# Patient Record
Sex: Female | Born: 1985 | Race: Black or African American | Hispanic: No | Marital: Single | Smoking: Current every day smoker
Health system: Southern US, Community
[De-identification: ages and names within clinical notes are randomized; demographics above are authoritative.]

---

## 2018-07-14 ENCOUNTER — Emergency Department (HOSPITAL_BASED_OUTPATIENT_CLINIC_OR_DEPARTMENT_OTHER)
Admission: EM | Admit: 2018-07-14 | Discharge: 2018-07-14 | Disposition: A | Discharge status: Home / Self Care | Payer: Self-pay | Attending: Emergency Medicine | Admitting: Emergency Medicine

## 2018-07-14 ENCOUNTER — Other Ambulatory Visit: Payer: Self-pay

## 2018-07-14 ENCOUNTER — Encounter (HOSPITAL_BASED_OUTPATIENT_CLINIC_OR_DEPARTMENT_OTHER): Payer: Self-pay

## 2018-07-14 DIAGNOSIS — K2920 Alcoholic gastritis without bleeding: Secondary | ICD-10-CM | POA: Insufficient documentation

## 2018-07-14 DIAGNOSIS — R112 Nausea with vomiting, unspecified: Secondary | ICD-10-CM | POA: Insufficient documentation

## 2018-07-14 DIAGNOSIS — R1013 Epigastric pain: Secondary | ICD-10-CM | POA: Insufficient documentation

## 2018-07-14 DIAGNOSIS — F17210 Nicotine dependence, cigarettes, uncomplicated: Secondary | ICD-10-CM | POA: Insufficient documentation

## 2018-07-14 LAB — COMPREHENSIVE METABOLIC PANEL
ALT: 16 U/L (ref 0–44)
ANION GAP: 12 (ref 5–15)
AST: 21 U/L (ref 15–41)
Albumin: 4.1 g/dL (ref 3.5–5.0)
Alkaline Phosphatase: 53 U/L (ref 38–126)
BILIRUBIN TOTAL: 0.4 mg/dL (ref 0.3–1.2)
BUN: 12 mg/dL (ref 6–20)
CO2: 22 mmol/L (ref 22–32)
Calcium: 9.1 mg/dL (ref 8.9–10.3)
Chloride: 105 mmol/L (ref 98–111)
Creatinine, Ser: 0.89 mg/dL (ref 0.44–1.00)
GFR calc Af Amer: >60 mL/min (ref >60)
GFR calc non Af Amer: >60 mL/min (ref >60)
Glucose, Bld: 91 mg/dL (ref 70–99)
POTASSIUM: 3.9 mmol/L (ref 3.5–5.1)
Sodium: 139 mmol/L (ref 135–145)
TOTAL PROTEIN: 7.5 g/dL (ref 6.5–8.1)

## 2018-07-14 LAB — URINALYSIS, ROUTINE W REFLEX MICROSCOPIC
Bilirubin Urine: NEGATIVE
Glucose, UA: NEGATIVE mg/dL
Hgb urine dipstick: NEGATIVE
KETONES UR: NEGATIVE mg/dL
LEUKOCYTES UA: NEGATIVE
Nitrite: NEGATIVE
PH: 7.5 (ref 5.0–8.0)
Protein, ur: NEGATIVE mg/dL
Specific Gravity, Urine: 1.02 (ref 1.005–1.030)

## 2018-07-14 LAB — CBC
HEMATOCRIT: 42.9 % (ref 36.0–46.0)
Hemoglobin: 14.7 g/dL (ref 12.0–15.0)
MCH: 26.5 pg (ref 26.0–34.0)
MCHC: 34.3 g/dL (ref 30.0–36.0)
MCV: 77.3 fL — ABNORMAL LOW (ref 78.0–100.0)
Platelets: 373 10*3/uL (ref 150–400)
RBC: 5.55 MIL/uL — ABNORMAL HIGH (ref 3.87–5.11)
RDW: 15.7 % — AB (ref 11.5–15.5)
WBC: 10.1 10*3/uL (ref 4.0–10.5)

## 2018-07-14 LAB — LIPASE, BLOOD: LIPASE: 22 U/L (ref 11–51)

## 2018-07-14 LAB — PREGNANCY, URINE: Preg Test, Ur: NEGATIVE

## 2018-07-14 MED ORDER — SODIUM CHLORIDE 0.9 % IV BOLUS
1000.0000 mL | Freq: Once | INTRAVENOUS | Status: AC
  Administered 2018-07-14: 1000 mL via INTRAVENOUS

## 2018-07-14 MED ORDER — OMEPRAZOLE 20 MG PO CPDR: 20.0000 mg | DELAYED_RELEASE_CAPSULE | Freq: Every day | ORAL | 0 refills | Status: DC

## 2018-07-14 MED ORDER — ONDANSETRON HCL 4 MG PO TABS: 4.0000 mg | ORAL_TABLET | Freq: Three times a day (TID) | ORAL | 0 refills | Status: DC | PRN

## 2018-07-14 MED ORDER — ONDANSETRON HCL 4 MG/2ML IJ SOLN: 4.0000 mg | Freq: Once | INTRAMUSCULAR | Status: DC | PRN

## 2018-07-14 MED ORDER — GI COCKTAIL ~~LOC~~
30.0000 mL | Freq: Once | ORAL | Status: AC
  Administered 2018-07-14: 30 mL via ORAL
  Filled 2018-07-14: qty 30

## 2018-07-14 NOTE — ED Notes (Signed)
ED Provider at bedside. 

## 2018-07-14 NOTE — ED Triage Notes (Signed)
C/o abd pain, n/v/d started this am-NAD-steady gait

## 2018-07-14 NOTE — ED Provider Notes (Signed)
[facility] EMERGENCY DEPARTMENT Provider Note   CSN: [id number] Arrival date & time: 07/14/18  2031     History   Chief Complaint Chief Complaint  Patient presents with  . Abdominal Pain    HPI Becky Green is a 32 y.o. female.  The history is provided by the patient, a significant other and medical records. No language interpreter was used.  Abdominal Pain   This is a new problem. The current episode started 12 to 24 hours ago. The problem occurs constantly. The problem has not changed since onset.The pain is associated with alcohol use. The pain is located in the LUQ, RUQ and epigastric region. The quality of the pain is aching and sharp. The pain is at a severity of 10/10. The pain is severe. Associated symptoms include diarrhea, nausea and vomiting. Pertinent negatives include fever, constipation, dysuria and frequency. The symptoms are aggravated by eating and palpation. Nothing relieves the symptoms.    History reviewed. No pertinent past medical history.  There are no active problems to display for this patient.   History reviewed. No pertinent surgical history.   OB History   None      Home Medications    Prior to Admission medications   Not on File    Family History No family history on file.  Social History Social History   Tobacco Use  . Smoking status: Current Every Day Smoker    Types: Cigarettes  . Smokeless tobacco: Never Used  Substance Use Topics  . Alcohol use: Yes    Comment: occ  . Drug use: Yes    Types: Marijuana     Allergies   Patient has no known allergies.   Review of Systems Review of Systems  Constitutional: Negative for chills, diaphoresis, fatigue and fever.  HENT: Negative for congestion and rhinorrhea.   Respiratory: Negative for cough, chest tightness, shortness of breath and wheezing.   Cardiovascular: Negative for chest pain and palpitations.  Gastrointestinal: Positive for abdominal pain,  diarrhea, nausea and vomiting. Negative for constipation.  Genitourinary: Negative for dysuria, flank pain and frequency.  Musculoskeletal: Negative for back pain, neck pain and neck stiffness.  Neurological: Negative for light-headedness and numbness.  Psychiatric/Behavioral: Negative for agitation.  All other systems reviewed and are negative.    Physical Exam Updated Vital Signs BP (!) 145/88 (BP Location: Right Arm)   Pulse 89   Temp 98.4 F (36.9 C) (Oral)   Resp 20   Ht 4\' 11"  (1.499 m)   Wt 88.9 kg   LMP  (LMP Unknown)   SpO2 100%   BMI 39.59 kg/m   Physical Exam  Constitutional: She appears well-developed and well-nourished.  Non-toxic appearance. She does not appear ill. No distress.  HENT:  Head: Normocephalic and atraumatic.  Mouth/Throat: Oropharynx is clear and moist.  Eyes: Pupils are equal, round, and reactive to light. Conjunctivae are normal.  Neck: Neck supple.  Cardiovascular: Normal rate and regular rhythm.  No murmur heard. Pulmonary/Chest: Effort normal and breath sounds normal. No respiratory distress.  Abdominal: Soft. Bowel sounds are normal. There is tenderness in the right upper quadrant, epigastric area and left upper quadrant. There is no rigidity, no rebound, no guarding, no CVA tenderness and negative Murphy's sign.  Musculoskeletal: She exhibits no edema.  Neurological: She is alert.  Skin: Skin is warm and dry. Capillary refill takes less than 2 seconds. She is not diaphoretic. No erythema. No pallor.  Psychiatric: She has a normal mood and affect.  Nursing note and vitals reviewed.    ED Treatments / Results  Labs (all labs ordered are listed, but only abnormal results are displayed) Labs Reviewed  CBC - Abnormal; Notable for the following components:      Result Value   RBC 5.55 (*)    MCV 77.3 (*)    RDW 15.7 (*)    All other components within normal limits  LIPASE, BLOOD  COMPREHENSIVE METABOLIC PANEL  URINALYSIS, ROUTINE W  REFLEX MICROSCOPIC  PREGNANCY, URINE    EKG None  Radiology No results found.  Procedures Procedures (including critical care time)  Medications Ordered in ED Medications  ondansetron (ZOFRAN) injection 4 mg (has no administration in time range)     Initial Impression / Assessment and Plan / ED Course  I have reviewed the triage vital signs and the nursing notes.  Pertinent labs & imaging results that were available during my care of the patient were reviewed by me and considered in my medical decision making (see chart for details).     Becky Green is a 32 y.o. female with no significant past medical history who presents with a 1 day history of nausea vomiting, diarrhea, and abdominal pain.  Patient reports that she did a heavy metal drinking last night with wine.  She would not disclose how much she drank.  She says that this morning she woke up feeling bad.  She reports she has had upper abdominal pain across her upper abdomen.  She describes as 10 out of 10 in severity and sharp and cramping.  She has had nausea, vomiting, all day.  She has not been able to tolerate p.o. earlier.  She reports no blood in her emesis.  She reports she is had loose stools but no blood in her stool.  She denies any urinary symptoms or vaginal symptoms.  She denies recent trauma.  She denies any history of gallstones or pancreatitis.  She denies any history of abdominal surgeries.  She reports when she was young she had a kidney stone but has not had any flank or back pain.  She has not taken any medicine to help her symptoms.  On exam, upper abdomen is tender to palpation in the right upper quadrant, left upper quadrant, and epigastrium.  No CVA tenderness or flank tenderness.  Bowel sounds are appreciated.  Lungs clear chest nontender.  Suspect pancreatitis or gastritis from heavy alcohol use last night.  Patient will have screening laboratory testing and urinalysis.  Patient will have nausea  medicine and pain medicine and fluids administered.  Anticipate reassessment after work-up.  Given minimal tenderness on exam, will hold on CT imaging initially.  If work-up is concerning or symptoms worsen, will likely get either ultrasound or CT scan.  11:31 PM Patient's laboratory testing was reassuring.  Patient was given a GI cocktail when she felt it was more burning discomfort.  Discomfort resolved her abdominal pain and nausea.    Patient likely has an alcoholic gastritis causing her symptoms.  She will be started on omeprazole and follow-up with a PCP.  She will give prescription for nausea medicine.  Patient encouraged to avoid alcohol and spicy foods.  Patient understood return precautions and was discharged in good condition after rehydration and feeling better.   Final Clinical Impressions(s) / ED Diagnoses   Final diagnoses:  Nausea and vomiting, intractability of vomiting not specified, unspecified vomiting type  Epigastric pain  Acute alcoholic gastritis without hemorrhage    ED Discharge Orders  Ordered    omeprazole (PRILOSEC) 20 MG capsule  Daily     07/14/18 2335    ondansetron (ZOFRAN) 4 MG tablet  Every 8 hours PRN     07/14/18 2335          Clinical Impression: 1. Nausea and vomiting, intractability of vomiting not specified, unspecified vomiting type   2. Epigastric pain   3. Acute alcoholic gastritis without hemorrhage     Disposition: Discharge  Condition: Good  I have discussed the results, Dx and Tx plan with the pt(& family if present). He/she/they expressed understanding and agree(s) with the plan. Discharge instructions discussed at great length. Strict return precautions discussed and pt &/or family have verbalized understanding of the instructions. No further questions at time of discharge.    New Prescriptions   OMEPRAZOLE (PRILOSEC) 20 MG CAPSULE    Take 1 capsule (20 mg total) by mouth daily.   ONDANSETRON (ZOFRAN) 4 MG TABLET     Take 1 tablet (4 mg total) by mouth every 8 (eight) hours as needed.    Follow Up: [facility] [address] [place] [address] [phone] Schedule an appointment as soon as possible for a visit    [facility] [address] [id number] [address] [place] [place] [address] [phone]       Tegeler, Canary Brim, MD 07/14/18 [phone]

## 2018-07-14 NOTE — Discharge Instructions (Signed)
We suspect her symptoms of abdominal pain nausea and vomiting were due to the drinking he did last night.  I suspect you have irritation called gastritis on her stomach lining.  Since your symptoms have improved after fluids, nausea medicine, and the GI cocktail, we feel you are safe for discharge home.  Please use the Prilosec to help with the stomach and follow-up with a PCP.  Please use the nausea medicine and maintain hydration.  If any symptoms change or worsen, please return to the nearest emergency department.

## 2018-09-03 ENCOUNTER — Emergency Department (HOSPITAL_BASED_OUTPATIENT_CLINIC_OR_DEPARTMENT_OTHER): Payer: Self-pay

## 2018-09-03 ENCOUNTER — Emergency Department (HOSPITAL_BASED_OUTPATIENT_CLINIC_OR_DEPARTMENT_OTHER)
Admission: EM | Admit: 2018-09-03 | Discharge: 2018-09-03 | Disposition: A | Discharge status: Home / Self Care | Payer: Self-pay | Attending: Emergency Medicine | Admitting: Emergency Medicine

## 2018-09-03 ENCOUNTER — Other Ambulatory Visit: Payer: Self-pay

## 2018-09-03 ENCOUNTER — Encounter (HOSPITAL_BASED_OUTPATIENT_CLINIC_OR_DEPARTMENT_OTHER): Payer: Self-pay | Admitting: Emergency Medicine

## 2018-09-03 DIAGNOSIS — N7093 Salpingitis and oophoritis, unspecified: Secondary | ICD-10-CM

## 2018-09-03 DIAGNOSIS — A599 Trichomoniasis, unspecified: Secondary | ICD-10-CM | POA: Insufficient documentation

## 2018-09-03 DIAGNOSIS — N83292 Other ovarian cyst, left side: Secondary | ICD-10-CM | POA: Insufficient documentation

## 2018-09-03 DIAGNOSIS — Z79899 Other long term (current) drug therapy: Secondary | ICD-10-CM | POA: Insufficient documentation

## 2018-09-03 DIAGNOSIS — R1084 Generalized abdominal pain: Secondary | ICD-10-CM | POA: Insufficient documentation

## 2018-09-03 DIAGNOSIS — R102 Pelvic and perineal pain: Secondary | ICD-10-CM | POA: Insufficient documentation

## 2018-09-03 DIAGNOSIS — N7091 Salpingitis, unspecified: Secondary | ICD-10-CM | POA: Insufficient documentation

## 2018-09-03 DIAGNOSIS — F17210 Nicotine dependence, cigarettes, uncomplicated: Secondary | ICD-10-CM | POA: Insufficient documentation

## 2018-09-03 LAB — COMPREHENSIVE METABOLIC PANEL
ALT: 19 U/L (ref 0–44)
ANION GAP: 13 (ref 5–15)
AST: 20 U/L (ref 15–41)
Albumin: 4 g/dL (ref 3.5–5.0)
Alkaline Phosphatase: 56 U/L (ref 38–126)
BILIRUBIN TOTAL: 0.3 mg/dL (ref 0.3–1.2)
BUN: 19 mg/dL (ref 6–20)
CHLORIDE: 104 mmol/L (ref 98–111)
CO2: 22 mmol/L (ref 22–32)
Calcium: 9.1 mg/dL (ref 8.9–10.3)
Creatinine, Ser: 0.81 mg/dL (ref 0.44–1.00)
Glucose, Bld: 107 mg/dL — ABNORMAL HIGH (ref 70–99)
POTASSIUM: 4 mmol/L (ref 3.5–5.1)
Sodium: 139 mmol/L (ref 135–145)
TOTAL PROTEIN: 7.3 g/dL (ref 6.5–8.1)

## 2018-09-03 LAB — WET PREP, GENITAL
CLUE CELLS WET PREP: NONE SEEN
Sperm: NONE SEEN
YEAST WET PREP: NONE SEEN

## 2018-09-03 LAB — CBC WITH DIFFERENTIAL/PLATELET
Abs Immature Granulocytes: 0.04 10*3/uL (ref 0.00–0.07)
BASOS PCT: 0 %
Basophils Absolute: 0 10*3/uL (ref 0.0–0.1)
EOS PCT: 1 %
Eosinophils Absolute: 0.1 10*3/uL (ref 0.0–0.5)
HCT: 42.8 % (ref 36.0–46.0)
Hemoglobin: 13.3 g/dL (ref 12.0–15.0)
IMMATURE GRANULOCYTES: 0 %
Lymphocytes Relative: 24 %
Lymphs Abs: 2.6 10*3/uL (ref 0.7–4.0)
MCH: 26.2 pg (ref 26.0–34.0)
MCHC: 31.1 g/dL (ref 30.0–36.0)
MCV: 84.3 fL (ref 80.0–100.0)
MONOS PCT: 6 %
Monocytes Absolute: 0.6 10*3/uL (ref 0.1–1.0)
NEUTROS PCT: 69 %
NRBC: 0 % (ref 0.0–0.2)
Neutro Abs: 7.5 10*3/uL (ref 1.7–7.7)
PLATELETS: 339 10*3/uL (ref 150–400)
RBC: 5.08 MIL/uL (ref 3.87–5.11)
RDW: 15.3 % (ref 11.5–15.5)
WBC: 10.9 10*3/uL — ABNORMAL HIGH (ref 4.0–10.5)

## 2018-09-03 LAB — PREGNANCY, URINE: PREG TEST UR: NEGATIVE

## 2018-09-03 LAB — URINALYSIS, ROUTINE W REFLEX MICROSCOPIC
Bilirubin Urine: NEGATIVE
Glucose, UA: NEGATIVE mg/dL
HGB URINE DIPSTICK: NEGATIVE
KETONES UR: NEGATIVE mg/dL
LEUKOCYTES UA: NEGATIVE
Nitrite: NEGATIVE
PROTEIN: NEGATIVE mg/dL
Specific Gravity, Urine: >1.03 — ABNORMAL HIGH (ref 1.005–1.030)
pH: 6 (ref 5.0–8.0)

## 2018-09-03 LAB — LIPASE, BLOOD: LIPASE: 31 U/L (ref 11–51)

## 2018-09-03 MED ORDER — KETOROLAC TROMETHAMINE 30 MG/ML IJ SOLN
30.0000 mg | Freq: Once | INTRAMUSCULAR | Status: AC
  Administered 2018-09-03: 30 mg via INTRAVENOUS
  Filled 2018-09-03: qty 1

## 2018-09-03 MED ORDER — IOPAMIDOL (ISOVUE-300) INJECTION 61%
30.0000 mL | Freq: Once | INTRAVENOUS | Status: AC | PRN
  Administered 2018-09-03: 15 mL via ORAL

## 2018-09-03 MED ORDER — DEXTROSE 5 % IV SOLN
250.0000 mg | Freq: Once | INTRAVENOUS | Status: AC
  Administered 2018-09-03: 250 mg via INTRAVENOUS
  Filled 2018-09-03: qty 250

## 2018-09-03 MED ORDER — ONDANSETRON 4 MG PO TBDP: 4.0000 mg | ORAL_TABLET | Freq: Three times a day (TID) | ORAL | 0 refills | Status: DC | PRN

## 2018-09-03 MED ORDER — METRONIDAZOLE 500 MG PO TABS
500.0000 mg | ORAL_TABLET | Freq: Once | ORAL | Status: AC
  Administered 2018-09-03: 500 mg via ORAL
  Filled 2018-09-03: qty 1

## 2018-09-03 MED ORDER — LOPERAMIDE HCL 2 MG PO CAPS
2.0000 mg | ORAL_CAPSULE | Freq: Once | ORAL | Status: AC
  Administered 2018-09-03: 2 mg via ORAL
  Filled 2018-09-03: qty 1

## 2018-09-03 MED ORDER — DOXYCYCLINE HYCLATE 100 MG PO TABS
100.0000 mg | ORAL_TABLET | Freq: Once | ORAL | Status: AC
  Administered 2018-09-03: 100 mg via ORAL
  Filled 2018-09-03: qty 1

## 2018-09-03 MED ORDER — CEFTRIAXONE SODIUM 250 MG IJ SOLR
INTRAMUSCULAR | Status: AC
  Administered 2018-09-03: 250 mg
  Filled 2018-09-03: qty 250

## 2018-09-03 MED ORDER — DOXYCYCLINE HYCLATE 100 MG PO CAPS: 100.0000 mg | ORAL_CAPSULE | Freq: Two times a day (BID) | ORAL | 0 refills | Status: DC

## 2018-09-03 MED ORDER — ONDANSETRON HCL 4 MG/2ML IJ SOLN
4.0000 mg | Freq: Once | INTRAMUSCULAR | Status: AC
  Administered 2018-09-03: 4 mg via INTRAVENOUS
  Filled 2018-09-03: qty 2

## 2018-09-03 MED ORDER — METRONIDAZOLE 500 MG PO TABS: 500.0000 mg | ORAL_TABLET | Freq: Two times a day (BID) | ORAL | 0 refills | Status: DC

## 2018-09-03 MED ORDER — OXYCODONE-ACETAMINOPHEN 5-325 MG PO TABS
1.0000 | ORAL_TABLET | Freq: Once | ORAL | Status: AC
  Administered 2018-09-03: 1 via ORAL
  Filled 2018-09-03: qty 1

## 2018-09-03 MED ORDER — OXYCODONE-ACETAMINOPHEN 5-325 MG PO TABS: 1.0000 | ORAL_TABLET | ORAL | 0 refills | Status: DC | PRN

## 2018-09-03 MED ORDER — SODIUM CHLORIDE 0.9 % IV BOLUS (SEPSIS)
1000.0000 mL | Freq: Once | INTRAVENOUS | Status: AC
  Administered 2018-09-03: 1000 mL via INTRAVENOUS

## 2018-09-03 MED ORDER — SODIUM CHLORIDE 0.9 % IV SOLN
INTRAVENOUS | Status: DC | PRN
  Administered 2018-09-03: 500 mL via INTRAVENOUS

## 2018-09-03 MED ORDER — IOPAMIDOL (ISOVUE-300) INJECTION 61%
100.0000 mL | Freq: Once | INTRAVENOUS | Status: AC | PRN
  Administered 2018-09-03: 100 mL via INTRAVENOUS

## 2018-09-03 MED FILL — DOXYCYCLINE HYCLATE 100 MG: 100 | 14 days supply | Qty: 28 | Fill #0

## 2018-09-03 MED FILL — metroNIDAZOLE 500 MG TABS: 500 | 10 days supply | Qty: 20 | Fill #0

## 2018-09-03 MED FILL — OXYCODONE-ACETAMINOPHEN 5-3: 5-325 | 2 days supply | Qty: 12 | Fill #0

## 2018-09-03 MED FILL — ONDANSETRON ODT 4 MG TABLET: 4 | 2 days supply | Qty: 8 | Fill #0

## 2018-09-03 NOTE — ED Provider Notes (Signed)
TIME SEEN: 6:35 AM  CHIEF COMPLAINT: abdominal pain  HPI: Patient is a 32 year old female with history of obesity, marijuana use who presents to the emergency department with 3 days of abdominal pain.  Reports she has been vomiting twice a day for the past week and a half.  Over the past 3 days has increased to 4 times a day and she had loose stools this morning.  No fever.  Has had some white vaginal discharge that she reports is not abnormal for her.  Last menstrual period was the beginning of November.  She has never been pregnant.  No history of STDs.  No previous abdominal surgeries.  No sick contacts or recent travel.  Describes the pain is more severe in the lower abdomen but is diffuse.  It is painful to move, have a bowel movement, eat.  No blood in her stool or melena.  Had a bowel movement here in the emergency department that was loose.  ROS: See HPI Constitutional: no fever  Eyes: no drainage  ENT: no runny nose   Cardiovascular:  no chest pain  Resp: no SOB  GI:  vomiting GU: no dysuria Integumentary: no rash  Allergy: no hives  Musculoskeletal: no leg swelling  Neurological: no slurred speech ROS otherwise negative  PAST MEDICAL HISTORY/PAST SURGICAL HISTORY:  History reviewed. No pertinent past medical history.  MEDICATIONS:  Prior to Admission medications   Medication Sig Start Date End Date Taking? Authorizing Provider  omeprazole (PRILOSEC) 20 MG capsule Take 1 capsule (20 mg total) by mouth daily. 07/14/18   Tegeler, Canary Brim, MD  ondansetron (ZOFRAN) 4 MG tablet Take 1 tablet (4 mg total) by mouth every 8 (eight) hours as needed. 07/14/18   Tegeler, Canary Brim, MD    ALLERGIES:  No Known Allergies  SOCIAL HISTORY:  Social History   Tobacco Use  . Smoking status: Current Every Day Smoker    Types: Cigarettes  . Smokeless tobacco: Never Used  Substance Use Topics  . Alcohol use: Yes    Comment: occ    FAMILY HISTORY: Family History  Problem  Relation Age of Onset  . Diabetes Mother     EXAM: BP (!) 127/97 (BP Location: Left Arm)   Pulse 92   Temp 98.2 F (36.8 C) (Oral)   Resp 20   Ht 5' (1.524 m)   Wt 91.6 kg   LMP 08/16/2018 (Approximate)   SpO2 99%   BMI 39.45 kg/m  CONSTITUTIONAL: Alert and oriented and responds appropriately to questions. Well-appearing; well-nourished HEAD: Normocephalic EYES: Conjunctivae clear, pupils appear equal, EOMI ENT: normal nose; moist mucous membranes NECK: Supple, no meningismus, no nuchal rigidity, no LAD  CARD: RRR; S1 and S2 appreciated; no murmurs, no clicks, no rubs, no gallops RESP: Normal chest excursion without splinting or tachypnea; breath sounds clear and equal bilaterally; no wheezes, no rhonchi, no rales, no hypoxia or respiratory distress, speaking full sentences ABD/GI: Normal bowel sounds; non-distended; soft, diffusely tender throughout the abdomen with voluntary guarding, patient tearful when I palpate anywhere on her abdomen, no hepatosplenomegaly, no masses appreciated, no hernia GU:  Normal external genitalia. No lesions, rashes noted. Patient has no vaginal bleeding on exam. No vaginal discharge.  She reports significant pain with speculum examination and bimanual examination.  She has no adnexal fullness or masses appreciated.  Cervix is not appear friable.  Cervix is closed.  Chaperone present for exam. BACK:  The back appears normal and is non-tender to palpation, there is no  CVA tenderness EXT: Normal ROM in all joints; non-tender to palpation; no edema; normal capillary refill; no cyanosis, no calf tenderness or swelling    SKIN: Normal color for age and race; warm; no rash NEURO: Moves all extremities equally PSYCH: The patient's mood and manner are appropriate. Grooming and personal hygiene are appropriate.  MEDICAL DECISION MAKING: Patient here with vomiting, diarrhea.  She is diffusely tender throughout her abdomen although she states most of her pain is in  her lower abdomen she guards with any palpation of her abdomen including the upper abdomen and is tearful.  Differential includes gastroenteritis, colitis, diverticulitis, appendicitis, cholecystitis, pancreatitis.  Urine obtained in triage shows no sign of infection and she is not pregnant.  Will obtain labs, perform pelvic exam with cultures, obtain CT of abdomen pelvis.  Will treat symptomatically with IV fluids, Toradol, Zofran, Imodium.  ED PROGRESS: Patient has diffuse tenderness on her pelvic examination.  She is tearful when I perform speculum and bimanual examination stating she is having a lot of vaginal discomfort.  No significant discharge noted.  Differential also includes torsion, TOA, PID but this would not explain her upper abdominal pain.  She has no known history of endometriosis.  Ultrasound not available until 8 AM.  Will start with CT imaging.  If this is a torsion, patient is outside the window for salvage of her ovary given symptoms ongoing for 3 days.  Pregnancy test is negative therefore I am not concerned for ectopic.   Signed out to Dr. Clarene DukeLittle to follow up on patient's labs and imaging.   I reviewed all nursing notes, vitals, pertinent previous records, EKGs, lab and urine results, imaging (as available).     Prabhnoor Ellenberger, Layla MawKristen N, DO 09/03/18 [phone]

## 2018-09-03 NOTE — ED Notes (Signed)
Patient transported to Ultrasound 

## 2018-09-03 NOTE — ED Triage Notes (Signed)
Pt is c/o abd pain and pelvic pain  Pain started 3 days ago  Pt states it hurts to urinate or have a bowel movement  Pt states she has had vomiting now for a while but has gotten worse over the past few days  Pt reports vaginal discharge white in color

## 2018-09-04 LAB — GC/CHLAMYDIA PROBE AMP (~~LOC~~) NOT AT ARMC
CHLAMYDIA, DNA PROBE: NEGATIVE
NEISSERIA GONORRHEA: NEGATIVE

## 2019-05-11 ENCOUNTER — Emergency Department (HOSPITAL_BASED_OUTPATIENT_CLINIC_OR_DEPARTMENT_OTHER)
Admission: EM | Admit: 2019-05-11 | Discharge: 2019-05-11 | Disposition: A | Discharge status: Home / Self Care | Payer: Self-pay | Attending: Emergency Medicine | Admitting: Emergency Medicine

## 2019-05-11 ENCOUNTER — Emergency Department (HOSPITAL_BASED_OUTPATIENT_CLINIC_OR_DEPARTMENT_OTHER): Payer: Self-pay

## 2019-05-11 ENCOUNTER — Other Ambulatory Visit: Payer: Self-pay

## 2019-05-11 ENCOUNTER — Encounter (HOSPITAL_BASED_OUTPATIENT_CLINIC_OR_DEPARTMENT_OTHER): Payer: Self-pay | Admitting: *Deleted

## 2019-05-11 DIAGNOSIS — F17210 Nicotine dependence, cigarettes, uncomplicated: Secondary | ICD-10-CM | POA: Insufficient documentation

## 2019-05-11 DIAGNOSIS — R197 Diarrhea, unspecified: Secondary | ICD-10-CM | POA: Insufficient documentation

## 2019-05-11 DIAGNOSIS — R112 Nausea with vomiting, unspecified: Secondary | ICD-10-CM | POA: Insufficient documentation

## 2019-05-11 DIAGNOSIS — J302 Other seasonal allergic rhinitis: Secondary | ICD-10-CM

## 2019-05-11 DIAGNOSIS — J301 Allergic rhinitis due to pollen: Secondary | ICD-10-CM | POA: Insufficient documentation

## 2019-05-11 DIAGNOSIS — A084 Viral intestinal infection, unspecified: Secondary | ICD-10-CM

## 2019-05-11 DIAGNOSIS — R1011 Right upper quadrant pain: Secondary | ICD-10-CM

## 2019-05-11 LAB — COMPREHENSIVE METABOLIC PANEL WITH GFR
ALT: 15 U/L (ref 0–44)
AST: 15 U/L (ref 15–41)
Albumin: 4.2 g/dL (ref 3.5–5.0)
Alkaline Phosphatase: 64 U/L (ref 38–126)
Anion gap: 11 (ref 5–15)
BUN: 18 mg/dL (ref 6–20)
CO2: 20 mmol/L — ABNORMAL LOW (ref 22–32)
Calcium: 9.1 mg/dL (ref 8.9–10.3)
Chloride: 104 mmol/L (ref 98–111)
Creatinine, Ser: 1.01 mg/dL — ABNORMAL HIGH (ref 0.44–1.00)
GFR calc Af Amer: >60 mL/min
GFR calc non Af Amer: >60 mL/min
Glucose, Bld: 91 mg/dL (ref 70–99)
Potassium: 4.3 mmol/L (ref 3.5–5.1)
Sodium: 135 mmol/L (ref 135–145)
Total Bilirubin: 0.3 mg/dL (ref 0.3–1.2)
Total Protein: 7.5 g/dL (ref 6.5–8.1)

## 2019-05-11 LAB — CBC
HCT: 46.9 % — ABNORMAL HIGH (ref 36.0–46.0)
Hemoglobin: 14.9 g/dL (ref 12.0–15.0)
MCH: 27.1 pg (ref 26.0–34.0)
MCHC: 31.8 g/dL (ref 30.0–36.0)
MCV: 85.3 fL (ref 80.0–100.0)
Platelets: 355 K/uL (ref 150–400)
RBC: 5.5 MIL/uL — ABNORMAL HIGH (ref 3.87–5.11)
RDW: 14.5 % (ref 11.5–15.5)
WBC: 9.1 K/uL (ref 4.0–10.5)
nRBC: 0 % (ref 0.0–0.2)

## 2019-05-11 LAB — URINALYSIS, ROUTINE W REFLEX MICROSCOPIC
Bilirubin Urine: NEGATIVE
Glucose, UA: NEGATIVE mg/dL
Hgb urine dipstick: NEGATIVE
Ketones, ur: NEGATIVE mg/dL
Leukocytes,Ua: NEGATIVE
Nitrite: NEGATIVE
Protein, ur: NEGATIVE mg/dL
Specific Gravity, Urine: 1.02 (ref 1.005–1.030)
pH: 7 (ref 5.0–8.0)

## 2019-05-11 LAB — PREGNANCY, URINE: Preg Test, Ur: NEGATIVE

## 2019-05-11 LAB — LIPASE, BLOOD: Lipase: 25 U/L (ref 11–51)

## 2019-05-11 MED ORDER — SODIUM CHLORIDE 0.9% FLUSH
3.0000 mL | Freq: Once | INTRAVENOUS | Status: DC
  Filled 2019-05-11: qty 3

## 2019-05-11 MED ORDER — LORATADINE 10 MG PO TABS: 10.0000 mg | ORAL_TABLET | Freq: Every day | ORAL | 0 refills | Status: DC

## 2019-05-11 MED ORDER — FLUTICASONE PROPIONATE 50 MCG/ACT NA SUSP: 2.0000 | Freq: Every day | NASAL | 0 refills | Status: DC

## 2019-05-11 MED ORDER — ONDANSETRON 4 MG PO TBDP
4.0000 mg | ORAL_TABLET | Freq: Once | ORAL | Status: AC
  Administered 2019-05-11: 4 mg via ORAL
  Filled 2019-05-11: qty 1

## 2019-05-11 MED ORDER — ALUM & MAG HYDROXIDE-SIMETH 200-200-20 MG/5ML PO SUSP
30.0000 mL | Freq: Once | ORAL | Status: AC
  Administered 2019-05-11: 30 mL via ORAL
  Filled 2019-05-11: qty 30

## 2019-05-11 MED ORDER — FAMOTIDINE 20 MG PO TABS
20.0000 mg | ORAL_TABLET | Freq: Once | ORAL | Status: AC
  Administered 2019-05-11: 18:00:00 20 mg via ORAL
  Filled 2019-05-11: qty 1

## 2019-05-11 MED ORDER — ONDANSETRON HCL 4 MG PO TABS: 4.0000 mg | ORAL_TABLET | Freq: Three times a day (TID) | ORAL | 0 refills | Status: DC | PRN

## 2019-05-11 NOTE — ED Notes (Signed)
Patient provided 4oz of fluid. Patient states she only had issues with holding food down.

## 2019-05-11 NOTE — Discharge Instructions (Signed)

## 2019-05-11 NOTE — ED Provider Notes (Signed)
[facility] EMERGENCY DEPARTMENT Provider Note   CSN: [id number] Arrival date & time: 05/11/19  1620    History   Chief Complaint Chief Complaint  Patient presents with   Sore Throat   Abdominal Pain    HPI Becky Kleinaneika Maillet is a 33 y.o. female.     HPI   Pt is a 33 y/o female who presents to the ED for eval of NV and swollen lymph nodes.  Nausea/vomiting/diarrhea: States that 2 days ago she ate a hamburger off the grill and since then she has had NVD. States that she has been unable to keep anything down since. Denies hematemesis or bloody stools. Denies dysuria but does have some suprapubic pain when she urinates. She denies having any abdominal pain.  States that the burger was cooked all the way and that no one else has similar sxs.  Swollen lymph nodes: States that she has had swollen lymph nodes since last week.  She also has an intermittent mild sore throat which seems to be worse in the morning.  She reports a lot of nasal congestion/postnasal drip from her allergies.  She states that she has had similar sxs in the past and that this happens every time the environment changes. she states she has had viscous lidocaine and pain meds which helped her symptoms. She was seen last week and had a COVID swab and a strep test which were negative.  She normally takes Benadryl for her allergies but does not take any other medications.   History reviewed. No pertinent past medical history.  There are no active problems to display for this patient.   No past surgical history on file.   OB History    Gravida  0   Para  0   Term  0   Preterm  0   AB  0   Living  0     SAB  0   TAB  0   Ectopic  0   Multiple  0   Live Births  0            Home Medications    Prior to Admission medications   Medication Sig Start Date End Date Taking? Authorizing Provider  doxycycline (VIBRAMYCIN) 100 MG capsule Take 1 capsule (100 mg total) by mouth 2 (two) times  daily. 09/03/18   Little, Ambrose Finlandachel Morgan, MD  fluticasone (FLONASE) 50 MCG/ACT nasal spray Place 2 sprays into both nostrils daily. 05/11/19   Davionne Mastrangelo S, PA-C  loratadine (CLARITIN) 10 MG tablet Take 1 tablet (10 mg total) by mouth daily. 05/11/19 06/10/19  Jaymon Dudek S, PA-C  metroNIDAZOLE (FLAGYL) 500 MG tablet Take 1 tablet (500 mg total) by mouth 2 (two) times daily. 09/03/18   Little, Ambrose Finlandachel Morgan, MD  omeprazole (PRILOSEC) 20 MG capsule Take 1 capsule (20 mg total) by mouth daily. 07/14/18   Tegeler, Canary Brimhristopher J, MD  ondansetron (ZOFRAN ODT) 4 MG disintegrating tablet Take 1 tablet (4 mg total) by mouth every 8 (eight) hours as needed for nausea or vomiting. 09/03/18   Little, Ambrose Finlandachel Morgan, MD  ondansetron (ZOFRAN) 4 MG tablet Take 1 tablet (4 mg total) by mouth every 8 (eight) hours as needed for nausea or vomiting. 05/11/19   Jamicia Haaland S, PA-C  oxyCODONE-acetaminophen (PERCOCET) 5-325 MG tablet Take 1 tablet by mouth every 4 (four) hours as needed. 09/03/18   Little, Ambrose Finlandachel Morgan, MD    Family History Family History  Problem Relation Age of Onset  Diabetes Mother     Social History Social History   Tobacco Use   Smoking status: Current Every Day Smoker    Packs/day: 1.00    Types: Cigarettes    Start date: 2000   Smokeless tobacco: Never Used   Tobacco comment: started smoking as a habit around age 33 /15  Substance Use Topics   Alcohol use: Yes    Comment: occ   Drug use: Yes    Types: Marijuana     Allergies   Patient has no known allergies.   Review of Systems Review of Systems  Constitutional: Negative for chills and fever.  HENT: Positive for congestion, rhinorrhea and sore throat. Negative for ear pain.   Eyes: Negative for visual disturbance.  Respiratory: Negative for cough and shortness of breath.   Cardiovascular: Negative for chest pain.  Gastrointestinal: Positive for diarrhea, nausea and vomiting. Negative for abdominal pain  and constipation.  Genitourinary: Negative for dysuria, hematuria and urgency.  Musculoskeletal: Negative for back pain.  Skin: Negative for rash.  Neurological: Negative for headaches.  All other systems reviewed and are negative.    Physical Exam Updated Vital Signs BP 132/78 (BP Location: Left Arm)    Pulse 76    Temp 98.3 F (36.8 C) (Oral)    Resp 16    Ht 4\' 11"  (1.499 m)    Wt 98.9 kg    LMP 05/02/2019    SpO2 98%    BMI 44.03 kg/m   Physical Exam Vitals signs and nursing note reviewed.  Constitutional:      General: She is not in acute distress.    Appearance: She is well-developed.  HENT:     Head: Normocephalic and atraumatic.     Nose: Congestion present.     Mouth/Throat:     Mouth: Mucous membranes are moist.     Pharynx: Uvula midline. No oropharyngeal exudate or posterior oropharyngeal erythema.  Eyes:     Conjunctiva/sclera: Conjunctivae normal.  Neck:     Musculoskeletal: Neck supple.  Cardiovascular:     Rate and Rhythm: Normal rate and regular rhythm.     Heart sounds: Normal heart sounds. No murmur.  Pulmonary:     Effort: Pulmonary effort is normal. No respiratory distress.     Breath sounds: Normal breath sounds. No wheezing, rhonchi or rales.  Abdominal:     General: Bowel sounds are normal. There is no distension.     Palpations: Abdomen is soft.     Tenderness: There is abdominal tenderness in the right upper quadrant and epigastric area. There is no right CVA tenderness, left CVA tenderness, guarding or rebound.  Skin:    General: Skin is warm and dry.  Neurological:     Mental Status: She is alert.     ED Treatments / Results  Labs (all labs ordered are listed, but only abnormal results are displayed) Labs Reviewed  COMPREHENSIVE METABOLIC PANEL - Abnormal; Notable for the following components:      Result Value   CO2 20 (*)    Creatinine, Ser 1.01 (*)    All other components within normal limits  CBC - Abnormal; Notable for the  following components:   RBC 5.50 (*)    HCT 46.9 (*)    All other components within normal limits  LIPASE, BLOOD  URINALYSIS, ROUTINE W REFLEX MICROSCOPIC  PREGNANCY, URINE    EKG None  Radiology [place] Abdomen Limited Ruq  Result Date: 05/11/2019 CLINICAL DATA:  RUQ pain, N/V  x 3 days; patient states she hasn't ate since this morning and she threw that up, had some ginger ale a couple hrs ago CT, 09/03/2018 EXAM: ULTRASOUND ABDOMEN LIMITED RIGHT UPPER QUADRANT COMPARISON:  None. FINDINGS: Gallbladder: Gallbladder is contracted but otherwise unremarkable. Common bile duct: Diameter: 0.5 mm Liver: No focal lesion identified. Within normal limits in parenchymal echogenicity. Portal vein is patent on color Doppler imaging with normal direction of blood flow towards the liver. IMPRESSION: Normal right upper quadrant ultrasound. Electronically Signed   By: Amie Portlandavid  Ormond M.D.   On: 05/11/2019 19:06    Procedures Procedures (including critical care time)  Medications Ordered in ED Medications  sodium chloride flush (NS) 0.9 % injection 3 mL (has no administration in time range)  famotidine (PEPCID) tablet 20 mg (20 mg Oral Given 05/11/19 1817)  alum & mag hydroxide-simeth (MAALOX/MYLANTA) 200-200-20 MG/5ML suspension 30 mL (30 mLs Oral Given 05/11/19 1817)  ondansetron (ZOFRAN-ODT) disintegrating tablet 4 mg (4 mg Oral Given 05/11/19 1817)     Initial Impression / Assessment and Plan / ED Course  I have reviewed the triage vital signs and the nursing notes.  Pertinent labs & imaging results that were available during my care of the patient were reviewed by me and considered in my medical decision making (see chart for details).      Final Clinical Impressions(s) / ED Diagnoses   Final diagnoses:  RUQ discomfort  Viral gastroenteritis  Seasonal allergies   Pt is a 33 y/o female who presents to the ED for eval of NV and swollen lymph nodes.  Swollen lymph nodes: Patient planing of  swollen lymph nodes and intermittent sore throat.  Has history of allergies that is treated only with Benadryl currently.  She already had negative strep and COVID testing at an outside facility earlier this week.  Seems less likely of infectious source at this time and more likely related to allergies.  I have advised her to take Claritin instead of Benadryl in the morning.  Have also advised Flonase.  Advised using a humidifier in her room and staying well-hydrated.  Nausea/vomiting/diarrhea: States that 2 days ago she ate a hamburger off the grill and since then she has had NVD.  She denies any abdominal pain but on exam she does have some epigastric and right upper quadrant tenderness.  Labs are reviewed and they are reassuring.  She has no leukocytosis.  Liver and kidney function are normal.  Electrolytes are within normal limits.  Lipase is negative.  UA negative for UTI.  Urine pregnancy test negative.  Given right upper quadrant tenderness and epigastric tenderness will obtain right upper quadrant ultrasound.  Patient given GI cocktail, Pepcid, Zofran, and on reassessment patient feels much improved.  She has been able to tolerate p.o.  She no longer has any pain..  Right upper quadrant ultrasound showed no evidence of acute cholecystitis or other abnormality.  We will treat symptomatically for suspected viral gastroenteritis and seasonal allergies.  Have given information for her to follow-up with PCP.  Advised on specific return precautions.  She voiced understanding and is in agreement with plan.  All questions answered.  Patient stable for discharge.  ED Discharge Orders         Ordered    loratadine (CLARITIN) 10 MG tablet  Daily     05/11/19 1942    fluticasone (FLONASE) 50 MCG/ACT nasal spray  Daily     05/11/19 1942    ondansetron (ZOFRAN) 4 MG tablet  Every 8 hours PRN     05/11/19 1942           Bishop Dublin 05/11/19 2110    Isla Pence, MD 05/11/19 2112

## 2019-05-11 NOTE — ED Triage Notes (Signed)
Sore throat and abdominal x 3 days. She has been vomiting and having diarrhea. She had a covid test a few days ago and it was negative.

## 2019-05-11 NOTE — ED Notes (Signed)
ED Provider at bedside. 

## 2019-05-11 NOTE — ED Notes (Signed)
Fluid challenge passed. Patient able to hold down, no nausea at this time.

## 2019-09-04 ENCOUNTER — Encounter (HOSPITAL_BASED_OUTPATIENT_CLINIC_OR_DEPARTMENT_OTHER): Payer: Self-pay | Admitting: Emergency Medicine

## 2019-09-04 ENCOUNTER — Other Ambulatory Visit: Payer: Self-pay

## 2019-09-04 ENCOUNTER — Emergency Department (HOSPITAL_BASED_OUTPATIENT_CLINIC_OR_DEPARTMENT_OTHER)
Admission: EM | Admit: 2019-09-04 | Discharge: 2019-09-04 | Disposition: A | Discharge status: Home / Self Care | Payer: Self-pay | Attending: Emergency Medicine | Admitting: Emergency Medicine

## 2019-09-04 ENCOUNTER — Emergency Department (HOSPITAL_BASED_OUTPATIENT_CLINIC_OR_DEPARTMENT_OTHER): Payer: Self-pay

## 2019-09-04 DIAGNOSIS — A5909 Other urogenital trichomoniasis: Secondary | ICD-10-CM | POA: Insufficient documentation

## 2019-09-04 DIAGNOSIS — N730 Acute parametritis and pelvic cellulitis: Secondary | ICD-10-CM | POA: Insufficient documentation

## 2019-09-04 DIAGNOSIS — F17210 Nicotine dependence, cigarettes, uncomplicated: Secondary | ICD-10-CM | POA: Insufficient documentation

## 2019-09-04 LAB — COMPREHENSIVE METABOLIC PANEL
ALT: 16 U/L (ref 0–44)
AST: 14 U/L — ABNORMAL LOW (ref 15–41)
Albumin: 4 g/dL (ref 3.5–5.0)
Alkaline Phosphatase: 56 U/L (ref 38–126)
Anion gap: 9 (ref 5–15)
BUN: 19 mg/dL (ref 6–20)
CO2: 22 mmol/L (ref 22–32)
Calcium: 9 mg/dL (ref 8.9–10.3)
Chloride: 105 mmol/L (ref 98–111)
Creatinine, Ser: 0.92 mg/dL (ref 0.44–1.00)
GFR calc Af Amer: >60 mL/min (ref >60)
GFR calc non Af Amer: >60 mL/min (ref >60)
Glucose, Bld: 91 mg/dL (ref 70–99)
Potassium: 4 mmol/L (ref 3.5–5.1)
Sodium: 136 mmol/L (ref 135–145)
Total Bilirubin: 0.4 mg/dL (ref 0.3–1.2)
Total Protein: 6.7 g/dL (ref 6.5–8.1)

## 2019-09-04 LAB — WET PREP, GENITAL
Sperm: NONE SEEN
Yeast Wet Prep HPF POC: NONE SEEN

## 2019-09-04 LAB — CBC WITH DIFFERENTIAL/PLATELET
Abs Immature Granulocytes: 0.02 10*3/uL (ref 0.00–0.07)
Basophils Absolute: 0 10*3/uL (ref 0.0–0.1)
Basophils Relative: 0 %
Eosinophils Absolute: 0.2 10*3/uL (ref 0.0–0.5)
Eosinophils Relative: 2 %
HCT: 44.9 % (ref 36.0–46.0)
Hemoglobin: 14.2 g/dL (ref 12.0–15.0)
Immature Granulocytes: 0 %
Lymphocytes Relative: 41 %
Lymphs Abs: 3.8 10*3/uL (ref 0.7–4.0)
MCH: 26.8 pg (ref 26.0–34.0)
MCHC: 31.6 g/dL (ref 30.0–36.0)
MCV: 84.9 fL (ref 80.0–100.0)
Monocytes Absolute: 0.7 10*3/uL (ref 0.1–1.0)
Monocytes Relative: 8 %
Neutro Abs: 4.5 10*3/uL (ref 1.7–7.7)
Neutrophils Relative %: 49 %
Platelets: 332 10*3/uL (ref 150–400)
RBC: 5.29 MIL/uL — ABNORMAL HIGH (ref 3.87–5.11)
RDW: 14.4 % (ref 11.5–15.5)
WBC: 9.3 10*3/uL (ref 4.0–10.5)
nRBC: 0 % (ref 0.0–0.2)

## 2019-09-04 LAB — URINALYSIS, ROUTINE W REFLEX MICROSCOPIC
Bilirubin Urine: NEGATIVE
Glucose, UA: NEGATIVE mg/dL
Hgb urine dipstick: NEGATIVE
Ketones, ur: NEGATIVE mg/dL
Leukocytes,Ua: NEGATIVE
Nitrite: NEGATIVE
Protein, ur: NEGATIVE mg/dL
Specific Gravity, Urine: 1.025 (ref 1.005–1.030)
pH: 7 (ref 5.0–8.0)

## 2019-09-04 LAB — LIPASE, BLOOD: Lipase: 27 U/L (ref 11–51)

## 2019-09-04 LAB — PREGNANCY, URINE: Preg Test, Ur: NEGATIVE

## 2019-09-04 MED ORDER — LIDOCAINE HCL (PF) 1 % IJ SOLN
INTRAMUSCULAR | Status: AC
  Administered 2019-09-04: 5 mL
  Filled 2019-09-04: qty 5

## 2019-09-04 MED ORDER — DOXYCYCLINE HYCLATE 100 MG PO CAPS: 100.0000 mg | ORAL_CAPSULE | Freq: Two times a day (BID) | ORAL | 0 refills | Status: DC

## 2019-09-04 MED ORDER — KETOROLAC TROMETHAMINE 15 MG/ML IJ SOLN
30.0000 mg | Freq: Once | INTRAMUSCULAR | Status: AC
  Administered 2019-09-04: 30 mg via INTRAMUSCULAR
  Filled 2019-09-04: qty 2

## 2019-09-04 MED ORDER — METRONIDAZOLE 500 MG PO TABS
2000.0000 mg | ORAL_TABLET | Freq: Once | ORAL | Status: AC
  Administered 2019-09-04: 2000 mg via ORAL
  Filled 2019-09-04: qty 4

## 2019-09-04 MED ORDER — CEFTRIAXONE SODIUM 250 MG IJ SOLR
250.0000 mg | Freq: Once | INTRAMUSCULAR | Status: AC
  Administered 2019-09-04: 250 mg via INTRAMUSCULAR
  Filled 2019-09-04: qty 250

## 2019-09-04 MED ORDER — DOXYCYCLINE HYCLATE 100 MG PO TABS
100.0000 mg | ORAL_TABLET | Freq: Once | ORAL | Status: AC
  Administered 2019-09-04: 100 mg via ORAL
  Filled 2019-09-04: qty 1

## 2019-09-04 NOTE — ED Notes (Signed)
Pelvic cart at room 

## 2019-09-04 NOTE — ED Provider Notes (Signed)
[facility] EMERGENCY DEPARTMENT Provider Note   CSN: [id number] Arrival date & time: 09/04/19  4098     History   Chief Complaint Chief Complaint  Patient presents with   Pelvic Pain    HPI Becky Green is a 33 y.o. female with history of STD who presents with abdominal pain.  Patient states that she has been having pain since last week when she started her period.  Her period started last Wednesday and lasted until Tuesday.  She is continued to have cramping pain since then.  The pain is in the left lower quadrant and radiates to the left flank.  She reports increased pain with any movement and walking.  She also has pain when she is having a bowel movement or is urinating.  She reports associated brown vaginal discharge.  She denies fever, chills, chest pain, shortness of breath, nausea, vomiting, diarrhea, dysuria, hematuria or frequency.  She states that she has 1 sexual partner but she recently found out that he has been unfaithful.  She denies any prior abdominal surgeries.  She has never been pregnant.     HPI  History reviewed. No pertinent past medical history.  There are no active problems to display for this patient.   History reviewed. No pertinent surgical history.   OB History    Gravida  0   Para  0   Term  0   Preterm  0   AB  0   Living  0     SAB  0   TAB  0   Ectopic  0   Multiple  0   Live Births  0            Home Medications    Prior to Admission medications   Medication Sig Start Date End Date Taking? Authorizing Provider  doxycycline (VIBRAMYCIN) 100 MG capsule Take 1 capsule (100 mg total) by mouth 2 (two) times daily. 09/03/18   Little, Ambrose Finland, MD  fluticasone (FLONASE) 50 MCG/ACT nasal spray Place 2 sprays into both nostrils daily. 05/11/19   Couture, Cortni S, PA-C  loratadine (CLARITIN) 10 MG tablet Take 1 tablet (10 mg total) by mouth daily. 05/11/19 06/10/19  Couture, Cortni S, PA-C  metroNIDAZOLE  (FLAGYL) 500 MG tablet Take 1 tablet (500 mg total) by mouth 2 (two) times daily. 09/03/18   Little, Ambrose Finland, MD  omeprazole (PRILOSEC) 20 MG capsule Take 1 capsule (20 mg total) by mouth daily. 07/14/18   Tegeler, Canary Brim, MD  ondansetron (ZOFRAN ODT) 4 MG disintegrating tablet Take 1 tablet (4 mg total) by mouth every 8 (eight) hours as needed for nausea or vomiting. 09/03/18   Little, Ambrose Finland, MD  ondansetron (ZOFRAN) 4 MG tablet Take 1 tablet (4 mg total) by mouth every 8 (eight) hours as needed for nausea or vomiting. 05/11/19   Couture, Cortni S, PA-C  oxyCODONE-acetaminophen (PERCOCET) 5-325 MG tablet Take 1 tablet by mouth every 4 (four) hours as needed. 09/03/18   Little, Ambrose Finland, MD    Family History Family History  Problem Relation Age of Onset   Diabetes Mother     Social History Social History   Tobacco Use   Smoking status: Current Every Day Smoker    Packs/day: 1.00    Types: Cigarettes    Start date: 2000   Smokeless tobacco: Never Used   Tobacco comment: started smoking as a habit around age 60 /15  Substance Use Topics   Alcohol use:  Yes    Comment: occ   Drug use: Yes    Types: Marijuana     Allergies   Patient has no known allergies.   Review of Systems Review of Systems  Constitutional: Negative for chills and fever.  Respiratory: Negative for shortness of breath.   Cardiovascular: Negative for chest pain.  Gastrointestinal: Positive for abdominal pain. Negative for constipation, diarrhea, nausea and vomiting.  Genitourinary: Positive for flank pain, pelvic pain and vaginal discharge. Negative for dysuria and vaginal bleeding.  All other systems reviewed and are negative.    Physical Exam Updated Vital Signs BP 134/75 (BP Location: Left Arm)    Pulse 87    Temp 98.6 F (37 C) (Oral)    Resp 18    Ht 4\' 11"  (1.499 m)    Wt 99.3 kg    LMP 08/25/2019    SpO2 100%    BMI 44.23 kg/m   Physical Exam Vitals signs and  nursing note reviewed.  Constitutional:      General: She is not in acute distress.    Appearance: Normal appearance. She is well-developed. She is not ill-appearing.  HENT:     Head: Normocephalic and atraumatic.  Eyes:     General: No scleral icterus.       Right eye: No discharge.        Left eye: No discharge.     Conjunctiva/sclera: Conjunctivae normal.     Pupils: Pupils are equal, round, and reactive to light.  Neck:     Musculoskeletal: Normal range of motion.  Cardiovascular:     Rate and Rhythm: Normal rate and regular rhythm.  Pulmonary:     Effort: Pulmonary effort is normal. No respiratory distress.     Breath sounds: Normal breath sounds.  Abdominal:     General: There is no distension.     Palpations: Abdomen is soft.     Tenderness: There is abdominal tenderness (LLQ and suprapubic tenderness). There is left CVA tenderness.  Genitourinary:    Comments: Pelvic: No inguinal lymphadenopathy or inguinal hernia noted. Normal external genitalia. No pain with speculum insertion. There is white clumpy discharge in the vaginal vault. There is +CMT and left adnexal tenderness on bimanual. Chaperone (Amy, NT) present during exam.   Skin:    General: Skin is warm and dry.  Neurological:     Mental Status: She is alert and oriented to person, place, and time.  Psychiatric:        Behavior: Behavior normal.      ED Treatments / Results  Labs (all labs ordered are listed, but only abnormal results are displayed) Labs Reviewed  WET PREP, GENITAL - Abnormal; Notable for the following components:      Result Value   Trich, Wet Prep PRESENT (*)    Clue Cells Wet Prep HPF POC PRESENT (*)    WBC, Wet Prep HPF POC FEW (*)    All other components within normal limits  CBC WITH DIFFERENTIAL/PLATELET - Abnormal; Notable for the following components:   RBC 5.29 (*)    All other components within normal limits  COMPREHENSIVE METABOLIC PANEL - Abnormal; Notable for the following  components:   AST 14 (*)    All other components within normal limits  URINALYSIS, ROUTINE W REFLEX MICROSCOPIC  PREGNANCY, URINE  LIPASE, BLOOD  GC/CHLAMYDIA PROBE AMP ([place]) NOT AT [facility]    EKG None  Radiology Ct Renal Stone Study  Result Date: 09/04/2019 CLINICAL DATA:  Left  low pelvic pain EXAM: CT ABDOMEN AND PELVIS WITHOUT CONTRAST TECHNIQUE: Multidetector CT imaging of the abdomen and pelvis was performed following the standard protocol without IV contrast. COMPARISON:  None. FINDINGS: Lower chest: Lung bases demonstrate no acute consolidation or effusion. The heart size is normal. Hepatobiliary: No focal liver abnormality is seen. No gallstones, gallbladder wall thickening, or biliary dilatation. Pancreas: Unremarkable. No pancreatic ductal dilatation or surrounding inflammatory changes. Spleen: Normal in size without focal abnormality. Adrenals/Urinary Tract: Adrenal glands are unremarkable. Kidneys are normal, without renal calculi, focal lesion, or hydronephrosis. Bladder is unremarkable. Stomach/Bowel: Stomach is within normal limits. Appendix appears normal. No evidence of bowel wall thickening, distention, or inflammatory changes. Vascular/Lymphatic: No significant vascular findings are present. No enlarged abdominal or pelvic lymph nodes. Reproductive: Uterus is unremarkable. 2.8 cm left ovarian cyst. Tubular structure contiguous with left ovary, felt to represent dilated fallopian tube. No significant surrounding inflammatory change. Other: No abdominal wall hernia or abnormality. No abdominopelvic ascites. Musculoskeletal: No acute or significant osseous findings. IMPRESSION: 1. Negative for hydronephrosis or ureteral stone. 2. 2.8 cm left ovarian cyst. This is associated with a oblong tubular structure at the left adnexa felt to represent dilated fallopian tube/hydrosalpinx. Given asymmetric left adnexal enlargement and history of left pelvic pain, correlation with ultrasound  could be obtained to exclude torsion. Electronically Signed   By: Donavan Foil M.D.   On: 09/04/2019 22:36    Procedures Procedures (including critical care time)  Medications Ordered in ED Medications  ketorolac (TORADOL) 15 MG/ML injection 30 mg (30 mg Intramuscular Given 09/04/19 2236)  cefTRIAXone (ROCEPHIN) injection 250 mg (250 mg Intramuscular Given 09/04/19 2236)  doxycycline (VIBRA-TABS) tablet 100 mg (100 mg Oral Given 09/04/19 2236)  lidocaine (PF) (XYLOCAINE) 1 % injection (5 mLs  Given 09/04/19 2236)  metroNIDAZOLE (FLAGYL) tablet 2,000 mg (2,000 mg Oral Given 09/04/19 2243)     Initial Impression / Assessment and Plan / ED Course  I have reviewed the triage vital signs and the nursing notes.  Pertinent labs & imaging results that were available during my care of the patient were reviewed by me and considered in my medical decision making (see chart for details).  33 year old female presents with gradually worsening left-sided pelvic pain and vaginal discharge for the past week and a half.  Her vital signs are normal.  She is tender over the left flank, left suprapubic area and left lower quadrant.  Pelvic exam was performed and shows a white clumpy discharge and she has cervical motion tenderness and left adnexal tenderness.  UA obtained in triage is normal.  She is not pregnant.  Will obtain labs.  We will also obtain CT renal since she states it feels similar to a kidney stone that she has had in the past.  Unfortunately we do not have ultrasound at this hour  Labs are reassuring.  Wet prep shows trichomonas.  CT renal shows left ovarian cyst with a dilated fallopian tube.  Patient history is more consistent with PID rather than ovarian torsion.  I do not think she needs to be emergently transferred for an ultrasound sound tonight specially since symptoms have been ongoing for over a week now.  She was given Rocephin, doxy, Flagyl.  She was given a prescription for Doxy.  She  was advised to return if worsening  Final Clinical Impressions(s) / ED Diagnoses   Final diagnoses:  PID (acute pelvic inflammatory disease)    ED Discharge Orders    None  Per Beagley Marie, PA-C 11/[place] Born22/20 1217    Charlynne PanderYao, David Hsienta, MD 09/05/19 [phone]

## 2019-09-04 NOTE — ED Triage Notes (Signed)
Patient states that she has her period 2 days ago and now has pain to her left lower pelvic area. The patient denies any N/v

## 2019-09-04 NOTE — ED Notes (Addendum)
Patient ambulated to CT

## 2019-09-04 NOTE — Discharge Instructions (Addendum)
Take Doxycycline twice daily for 2 weeks Take Tylenol and Ibuprofen for pain Please return if you are worsening

## 2019-09-04 NOTE — ED Notes (Signed)
Patient transported to CT 

## 2019-09-07 LAB — GC/CHLAMYDIA PROBE AMP (~~LOC~~) NOT AT ARMC
Chlamydia: NEGATIVE
Neisseria Gonorrhea: NEGATIVE

## 2020-03-20 ENCOUNTER — Emergency Department (HOSPITAL_BASED_OUTPATIENT_CLINIC_OR_DEPARTMENT_OTHER)
Admission: EM | Admit: 2020-03-20 | Discharge: 2020-03-20 | Disposition: A | Discharge status: Home / Self Care | Payer: Medicaid Other | Attending: Emergency Medicine | Admitting: Emergency Medicine

## 2020-03-20 ENCOUNTER — Other Ambulatory Visit: Payer: Self-pay

## 2020-03-20 ENCOUNTER — Encounter (HOSPITAL_BASED_OUTPATIENT_CLINIC_OR_DEPARTMENT_OTHER): Payer: Self-pay

## 2020-03-20 DIAGNOSIS — G5603 Carpal tunnel syndrome, bilateral upper limbs: Secondary | ICD-10-CM | POA: Insufficient documentation

## 2020-03-20 DIAGNOSIS — M791 Myalgia, unspecified site: Secondary | ICD-10-CM | POA: Insufficient documentation

## 2020-03-20 DIAGNOSIS — F1210 Cannabis abuse, uncomplicated: Secondary | ICD-10-CM | POA: Insufficient documentation

## 2020-03-20 DIAGNOSIS — M79672 Pain in left foot: Secondary | ICD-10-CM | POA: Insufficient documentation

## 2020-03-20 DIAGNOSIS — M79671 Pain in right foot: Secondary | ICD-10-CM | POA: Insufficient documentation

## 2020-03-20 DIAGNOSIS — R2243 Localized swelling, mass and lump, lower limb, bilateral: Secondary | ICD-10-CM | POA: Insufficient documentation

## 2020-03-20 DIAGNOSIS — F17210 Nicotine dependence, cigarettes, uncomplicated: Secondary | ICD-10-CM | POA: Insufficient documentation

## 2020-03-20 DIAGNOSIS — R202 Paresthesia of skin: Secondary | ICD-10-CM | POA: Insufficient documentation

## 2020-03-20 DIAGNOSIS — M7989 Other specified soft tissue disorders: Secondary | ICD-10-CM

## 2020-03-20 MED ORDER — GABAPENTIN 100 MG PO CAPS: 100.0000 mg | ORAL_CAPSULE | Freq: Three times a day (TID) | ORAL | 0 refills | Status: DC | PRN

## 2020-03-20 MED ORDER — PREDNISONE 10 MG PO TABS: 40.0000 mg | ORAL_TABLET | Freq: Every day | ORAL | 0 refills | Status: AC

## 2020-03-20 MED ORDER — PREDNISONE 50 MG PO TABS
60.0000 mg | ORAL_TABLET | Freq: Once | ORAL | Status: AC
  Administered 2020-03-20: 60 mg via ORAL
  Filled 2020-03-20: qty 1

## 2020-03-20 MED ORDER — HYDROCODONE-ACETAMINOPHEN 5-325 MG PO TABS
1.0000 | ORAL_TABLET | Freq: Once | ORAL | Status: AC
  Administered 2020-03-20: 1 via ORAL
  Filled 2020-03-20: qty 1

## 2020-03-20 MED ORDER — PREDNISONE 10 MG PO TABS: 40.0000 mg | ORAL_TABLET | Freq: Every day | ORAL | 0 refills | Status: DC

## 2020-03-20 NOTE — Discharge Instructions (Addendum)
You were seen today due to hand pain and feet swelling. I think you have carpal tunnel in your hands. This is usually from repetitive movements in your hands. I have prescribed you some medications for this. The next step would be to se the ortho specialist if the medicines do not work. It would be helpful if you can rest for the next few days so I have given you a note to stay out of work the next few days if you would like to use it. Your job may also be causing the swelling in your feet. You can wear compression stockings at work and make sure that you elevate your legs when you are not up and walking. Thank you for allowing me to care for you today. Please return to the emergency department if you have new or worsening symptoms. Take your medications as instructed.

## 2020-03-20 NOTE — ED Provider Notes (Signed)
[facility] EMERGENCY DEPARTMENT Provider Note   CSN: [id number] Arrival date & time: 03/20/20  1247     History Chief Complaint  Patient presents with  . Leg Swelling    Becky Green is a 34 y.o. female.  Patient is a 34 year old female with no significant past medical history presenting to the emergency department for bilateral hand pain as well as bilateral feet swelling.  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  Reports that a few days ago she began to have severe pain in her bilateral hands with burning and tingling.  The pain in symptoms sometimes wake her up at nighttime.  Sometimes she has swelling in her hands as well.  She feels like it is in all fingers. Seen 1 month ago at another facility and diagnosed with carpal tunnel and told to f/u with ortho but does not have insurance. Has been taking NSAIDS, muscle relaxant and wearing braces with no relief.         History reviewed. No pertinent past medical history.  There are no problems to display for this patient.   History reviewed. No pertinent surgical history.   OB History    Gravida  0   Para  0   Term  0   Preterm  0   AB  0   Living  0     SAB  0   TAB  0   Ectopic  0   Multiple  0   Live Births  0           Family History  Problem Relation Age of Onset  . Diabetes Mother     Social History   Tobacco Use  . Smoking status: Current Every Day Smoker    Packs/day: 1.00    Types: Cigarettes    Start date: 2000  . Smokeless tobacco: Never Used  Substance Use Topics  . Alcohol use: Yes    Comment: occ  . Drug use: Yes    Types: Marijuana    Home Medications Prior to Admission medications   Medication Sig Start Date End Date Taking? Authorizing Provider  doxycycline (VIBRAMYCIN) 100 MG capsule Take 1 capsule (100 mg total) by mouth 2 (two)  times daily. 09/04/19   Bethel Born, PA-C  fluticasone (FLONASE) 50 MCG/ACT nasal spray Place 2 sprays into both nostrils daily. 05/11/19   Couture, Cortni S, PA-C  gabapentin (NEURONTIN) 100 MG capsule Take 1 capsule (100 mg total) by mouth 3 (three) times daily as needed for up to 14 days. 03/20/20 04/03/20  Arlyn Dunning, PA-C  loratadine (CLARITIN) 10 MG tablet Take 1 tablet (10 mg total) by mouth daily. 05/11/19 06/10/19  Couture, Cortni S, PA-C  metroNIDAZOLE (FLAGYL) 500 MG tablet Take 1 tablet (500 mg total) by mouth 2 (two) times daily. 09/03/18   Little, Ambrose Finland, MD  omeprazole (PRILOSEC) 20 MG capsule Take 1 capsule (20 mg total) by mouth daily. 07/14/18   Tegeler, Canary Brim, MD  ondansetron (ZOFRAN ODT) 4 MG disintegrating tablet Take 1 tablet (4 mg total) by mouth every 8 (eight) hours as needed for nausea or vomiting. 09/03/18   Little, Ambrose Finland, MD  ondansetron (ZOFRAN) 4 MG tablet Take 1 tablet (4 mg total) by mouth every 8 (eight) hours as needed for nausea or vomiting. 05/11/19   Couture,  Cortni S, PA-C  oxyCODONE-acetaminophen (PERCOCET) 5-325 MG tablet Take 1 tablet by mouth every 4 (four) hours as needed. 09/03/18   Little, Wenda Overland, MD  predniSONE (DELTASONE) 10 MG tablet Take 4 tablets (40 mg total) by mouth daily for 5 days. 03/20/20 03/25/20  Alveria Apley, PA-C    Allergies    Patient has no known allergies.  Review of Systems   Review of Systems  Constitutional: Negative for chills, fatigue and fever.  HENT: Negative.   Respiratory: Negative for shortness of breath.   Cardiovascular: Positive for leg swelling. Negative for chest pain.  Gastrointestinal: Negative for abdominal pain.  Musculoskeletal: Positive for arthralgias and myalgias. Negative for back pain, joint swelling, neck pain and neck stiffness.  Skin: Negative for color change, rash and wound.  Neurological: Positive for numbness. Negative for dizziness, light-headedness and  headaches.  All other systems reviewed and are negative.   Physical Exam Updated Vital Signs BP (!) 146/97 (BP Location: Left Arm)   Pulse 79   Temp 98.2 F (36.8 C) (Oral)   Resp 16   Ht 5' (1.524 m)   Wt 89.3 kg   LMP 03/13/2020   SpO2 100%   BMI 38.45 kg/m   Physical Exam Vitals and nursing note reviewed.  Constitutional:      General: She is not in acute distress.    Appearance: Normal appearance. She is not ill-appearing, toxic-appearing or diaphoretic.  HENT:     Head: Normocephalic.  Eyes:     Conjunctiva/sclera: Conjunctivae normal.  Pulmonary:     Effort: Pulmonary effort is normal.  Abdominal:     General: Abdomen is flat.     Palpations: Abdomen is soft.  Musculoskeletal:     Right wrist: Tenderness present. No swelling, deformity, effusion or bony tenderness. Normal range of motion.     Left wrist: Tenderness present. No swelling, deformity, effusion or bony tenderness. Normal range of motion.     Right hand: Decreased sensation. Normal capillary refill. Normal pulse.     Left hand: Deformity: Reports decreased sensation in bilateral middle fingers. Decreased sensation. Normal capillary refill. Normal pulse.     Comments: + tinel, + phalens. Patient's feet/leg swelling is not appreciable on my exam today. 2+ pedal pulses. Normal distal strength, sensation in BLE. Hand with decreased grip strength bilaterally.   Skin:    General: Skin is dry.     Coloration: Skin is not jaundiced.     Findings: No bruising, erythema, lesion or rash.  Neurological:     General: No focal deficit present.     Mental Status: She is alert.  Psychiatric:        Mood and Affect: Mood normal.     ED Results / Procedures / Treatments   Labs (all labs ordered are listed, but only abnormal results are displayed) Labs Reviewed - No data to display  EKG None  Radiology No results found.  Procedures Procedures (including critical care time)  Medications Ordered in  ED Medications  HYDROcodone-acetaminophen (NORCO/VICODIN) 5-325 MG per tablet 1 tablet (1 tablet Oral Given 03/20/20 1343)  predniSONE (DELTASONE) tablet 60 mg (60 mg Oral Given 03/20/20 1343)    ED Course  I have reviewed the triage vital signs and the nursing notes.  Pertinent labs & imaging results that were available during my care of the patient were reviewed by me and considered in my medical decision making (see chart for details).  Clinical Course as of Mar 20 1409  Mon Mar 20, 2020  1332 Patient presenting with bilateral hand numbness, tingling, pain for a few days. Hx and physical exam are consistent with carpal tunnel syndrome. She was diagnosed with the same one month ago at another hospital and started on flexeril and naproxen. She was told to f/u with ortho but does not have insurance. She is requesting further pain relief. She has braces but feel they make her pain worse.   Her second complaint is BLE feet swelling which comes and goes. No injury or trauma. Do not appear significantly swollen today and she does not have calf pain. -homans. I suspect swelling likely due to being on feet often during her work.   Plan to further treat her carpal tunnel with prednisone and gabapentin. She was again advised to continue wearing braces, rest and f/u with ortho if she can   [KM]    Clinical Course User Index [KM] Jeral Pinch   MDM Rules/Calculators/A&P                      Based on review of vitals, medical screening exam, lab work and/or imaging, there does not appear to be an acute, emergent etiology for the patient's symptoms. Counseled pt on good return precautions and encouraged both PCP and ED follow-up as needed.  Prior to discharge, I also discussed incidental imaging findings with patient in detail and advised appropriate, recommended follow-up in detail.  Clinical Impression: 1. Bilateral carpal tunnel syndrome   2. Bilateral swelling of feet     Disposition:  Discharge  Prior to providing a prescription for a controlled substance, I independently reviewed the patient's recent prescription history on the [place] Controlled Substance Reporting System. The patient had no recent or regular prescriptions and was deemed appropriate for a brief, less than 3 day prescription of narcotic for acute analgesia.  This note was prepared with assistance of Conservation officer, historic buildings. Occasional wrong-word or sound-a-like substitutions may have occurred due to the inherent limitations of voice recognition software.  Final Clinical Impression(s) / ED Diagnoses Final diagnoses:  Bilateral carpal tunnel syndrome  Bilateral swelling of feet    Rx / DC Orders ED Discharge Orders         Ordered    predniSONE (DELTASONE) 10 MG tablet  Daily     03/20/20 1410    gabapentin (NEURONTIN) 100 MG capsule  3 times daily PRN     03/20/20 1410           Jeral Pinch 03/20/20 1410    Melene Plan, DO 03/20/20 1450

## 2020-03-20 NOTE — ED Triage Notes (Signed)
Pt c/o swelling to LE and UE-sx started with UE swelling/numbness-was seen by [facility] ED last week-was referred to ortho-has appt 6/10-states she is having "too much pain"-NAD-steady gait

## 2020-07-27 ENCOUNTER — Other Ambulatory Visit (HOSPITAL_BASED_OUTPATIENT_CLINIC_OR_DEPARTMENT_OTHER): Payer: Self-pay | Admitting: Emergency Medicine

## 2020-07-27 ENCOUNTER — Encounter (HOSPITAL_BASED_OUTPATIENT_CLINIC_OR_DEPARTMENT_OTHER): Payer: Self-pay | Admitting: Emergency Medicine

## 2020-07-27 ENCOUNTER — Emergency Department (HOSPITAL_BASED_OUTPATIENT_CLINIC_OR_DEPARTMENT_OTHER)
Admission: EM | Admit: 2020-07-27 | Discharge: 2020-07-27 | Disposition: A | Discharge status: Home / Self Care | Payer: 59 | Attending: Emergency Medicine | Admitting: Emergency Medicine

## 2020-07-27 ENCOUNTER — Other Ambulatory Visit: Payer: Self-pay

## 2020-07-27 DIAGNOSIS — X58XXXA Exposure to other specified factors, initial encounter: Secondary | ICD-10-CM | POA: Diagnosis not present

## 2020-07-27 DIAGNOSIS — S4992XA Unspecified injury of left shoulder and upper arm, initial encounter: Secondary | ICD-10-CM | POA: Diagnosis present

## 2020-07-27 DIAGNOSIS — S46912A Strain of unspecified muscle, fascia and tendon at shoulder and upper arm level, left arm, initial encounter: Secondary | ICD-10-CM | POA: Diagnosis not present

## 2020-07-27 DIAGNOSIS — L049 Acute lymphadenitis, unspecified: Secondary | ICD-10-CM | POA: Diagnosis not present

## 2020-07-27 DIAGNOSIS — N760 Acute vaginitis: Secondary | ICD-10-CM | POA: Diagnosis not present

## 2020-07-27 DIAGNOSIS — B9689 Other specified bacterial agents as the cause of diseases classified elsewhere: Secondary | ICD-10-CM | POA: Diagnosis not present

## 2020-07-27 DIAGNOSIS — F17210 Nicotine dependence, cigarettes, uncomplicated: Secondary | ICD-10-CM | POA: Diagnosis not present

## 2020-07-27 DIAGNOSIS — I889 Nonspecific lymphadenitis, unspecified: Secondary | ICD-10-CM

## 2020-07-27 LAB — URINALYSIS, ROUTINE W REFLEX MICROSCOPIC
Bilirubin Urine: NEGATIVE
Glucose, UA: NEGATIVE mg/dL
Hgb urine dipstick: NEGATIVE
Ketones, ur: NEGATIVE mg/dL
Leukocytes,Ua: NEGATIVE
Nitrite: NEGATIVE
Protein, ur: NEGATIVE mg/dL
Specific Gravity, Urine: 1.025 (ref 1.005–1.030)
pH: 6.5 (ref 5.0–8.0)

## 2020-07-27 LAB — WET PREP, GENITAL
Sperm: NONE SEEN
Trich, Wet Prep: NONE SEEN
Yeast Wet Prep HPF POC: NONE SEEN

## 2020-07-27 LAB — HIV ANTIBODY (ROUTINE TESTING W REFLEX): HIV Screen 4th Generation wRfx: NONREACTIVE

## 2020-07-27 LAB — PREGNANCY, URINE: Preg Test, Ur: NEGATIVE

## 2020-07-27 MED ORDER — METRONIDAZOLE 500 MG PO TABS: 500.0000 mg | ORAL_TABLET | Freq: Two times a day (BID) | ORAL | 0 refills | Status: DC

## 2020-07-27 MED ORDER — AMOXICILLIN 500 MG PO CAPS: 500.0000 mg | ORAL_CAPSULE | Freq: Three times a day (TID) | ORAL | 0 refills | Status: DC

## 2020-07-27 MED FILL — AMOXICILLIN 500 MG CAPSULE: 500 | 7 days supply | Qty: 21 | Fill #0

## 2020-07-27 MED FILL — METRONIDAZOLE 500 MG TABS: 500 | 7 days supply | Qty: 14 | Fill #0

## 2020-07-27 NOTE — ED Triage Notes (Addendum)
Pt sts her lymph glands on the left side of her neck are swollen.  She sts this happens 4 times a year when the weather changes and she goes to the Ed to get steroids and abx.  Also left shoulder is hurting.  Also has vaginal discharge for a week.  Denies vaginal itching or burning.

## 2020-07-27 NOTE — ED Notes (Signed)
ED Provider at bedside. 

## 2020-07-27 NOTE — ED Provider Notes (Signed)
[facility] EMERGENCY DEPARTMENT Provider Note   CSN: [id number] Arrival date & time: 07/27/20  0092     History Chief Complaint  Patient presents with  . Multiple complaints    Becky Green is a 34 y.o. female.  The history is provided by the patient.   Becky Green is a 34 y.o. female who presents to the Emergency Department complaining of multiple complaints. She presents to the emergency department complaining of three separate complaints. First complaint is swelling to her glands in the left side of her neck. She states that this is been going on for a couple of days. She has local pain to the area. Pain is worse with swallowing. She gets these episodes four times a year and they are normally resolved with prednisone and antibiotics. No reports of fevers, difficulty swallowing, difficulty breathing, dental pain. Her second complaint is vaginally discharge for the last week. She has no itching or burning. She is sexually active with a new partner. She occasionally uses protection. No abdominal pain. Her third complaint is pain to her left upper shoulder where the bra strap crosses across her shoulder. She is right-hand dominant. No it no injuries. Pain is worse when she moves the shoulder. She has been applying over-the-counter pain patches for her symptoms.    History reviewed. No pertinent past medical history.  There are no problems to display for this patient.   History reviewed. No pertinent surgical history.   OB History    Gravida  0   Para  0   Term  0   Preterm  0   AB  0   Living  0     SAB  0   TAB  0   Ectopic  0   Multiple  0   Live Births  0           Family History  Problem Relation Age of Onset  . Diabetes Mother     Social History   Tobacco Use  . Smoking status: Current Every Day Smoker    Packs/day: 1.00    Types: Cigarettes    Start date: 2000  . Smokeless tobacco: Never Used  Vaping Use  . Vaping Use:  Never used  Substance Use Topics  . Alcohol use: Yes    Comment: occ  . Drug use: Yes    Types: Marijuana    Home Medications Prior to Admission medications   Medication Sig Start Date End Date Taking? Authorizing Provider  amoxicillin (AMOXIL) 500 MG capsule Take 1 capsule (500 mg total) by mouth 3 (three) times daily. 07/27/20   Tilden Fossa, MD  fluticasone [facility]) 50 MCG/ACT nasal spray Place 2 sprays into both nostrils daily. 05/11/19   Couture, Cortni S, PA-C  gabapentin (NEURONTIN) 100 MG capsule Take 1 capsule (100 mg total) by mouth 3 (three) times daily as needed for up to 14 days. 03/20/20 04/03/20  Arlyn Dunning, PA-C  loratadine (CLARITIN) 10 MG tablet Take 1 tablet (10 mg total) by mouth daily. 05/11/19 06/10/19  Couture, Cortni S, PA-C  metroNIDAZOLE (FLAGYL) 500 MG tablet Take 1 tablet (500 mg total) by mouth 2 (two) times daily. 07/27/20   Tilden Fossa, MD  omeprazole (PRILOSEC) 20 MG capsule Take 1 capsule (20 mg total) by mouth daily. 07/14/18   Tegeler, Canary Brim, MD    Allergies    Patient has no known allergies.  Review of Systems   Review of Systems  All other systems reviewed  and are negative.   Physical Exam Updated Vital Signs BP 124/75 (BP Location: Right Arm)   Pulse 82   Temp 98.3 F (36.8 C) (Oral)   Resp 18   Ht 5' (1.524 m)   Wt 90.7 kg   LMP 07/04/2020   SpO2 100%   BMI 39.06 kg/m   Physical Exam Vitals and nursing note reviewed.  Constitutional:      Appearance: She is well-developed.  HENT:     Head: Normocephalic and atraumatic.     Comments: No significant erythema or edema in the posterior oropharynx. Neck:     Comments: Mild tender left cervical lymphadenopathy. Cardiovascular:     Rate and Rhythm: Normal rate and regular rhythm.     Heart sounds: No murmur heard.   Pulmonary:     Effort: Pulmonary effort is normal. No respiratory distress.     Breath sounds: Normal breath sounds.  Abdominal:     Palpations:  Abdomen is soft.     Tenderness: There is no abdominal tenderness. There is no guarding or rebound.  Genitourinary:    Comments: Small amount of white vaginal discharge. No CMT. Musculoskeletal:     Cervical back: Neck supple.     Comments: Range of motion is intact in the left shoulder. There is mild tenderness to palpation over the left upper shoulder, clavicle and trapezius. No overlying rashes.  Skin:    General: Skin is warm and dry.  Neurological:     Mental Status: She is alert and oriented to person, place, and time.  Psychiatric:        Behavior: Behavior normal.     ED Results / Procedures / Treatments   Labs (all labs ordered are listed, but only abnormal results are displayed) Labs Reviewed  WET PREP, GENITAL - Abnormal; Notable for the following components:      Result Value   Clue Cells Wet Prep HPF POC PRESENT (*)    WBC, Wet Prep HPF POC MANY (*)    All other components within normal limits  PREGNANCY, URINE  URINALYSIS, COMPLETE (UACMP) WITH MICROSCOPIC  RPR  HIV ANTIBODY (ROUTINE TESTING W REFLEX)  URINALYSIS, ROUTINE W REFLEX MICROSCOPIC  GC/CHLAMYDIA PROBE AMP ([place]) NOT AT [facility]    EKG None  Radiology No results found.  Procedures Procedures (including critical care time)  Medications Ordered in ED Medications - No data to display  ED Course  I have reviewed the triage vital signs and the nursing notes.  Pertinent labs & imaging results that were available during my care of the patient were reviewed by me and considered in my medical decision making (see chart for details).    MDM Rules/Calculators/A&P                         Patient here for three complaints.  1: swollen lymph nodes. She does have lymphadenopathy on examination she did, she is non-toxic appearing. No evidence of abscess. Discussed with patient home care for cervical lymph at night's and importance of PCP follow-up for further evaluation is this is a recurrent issue.  Also recommend that she starts over-the-counter allergy medication such as Zyrtec for her symptoms.  2: vaginal discharge. Pelvic examination is benign, no evidence of PID. Wet prep is significant for clue cells, will treat with Flagyl.  3: left shoulder pain. No evidence of acute fracture, dislocation or infectious process. Discussed home care for muscle strain. Recommend warm compresses, OTC pain  patches or ibuprofen.  Final Clinical Impression(s) / ED Diagnoses Final diagnoses:  BV (bacterial vaginosis)  Lymphadenitis  Strain of left shoulder, initial encounter    Rx / DC Orders ED Discharge Orders         Ordered    metroNIDAZOLE (FLAGYL) 500 MG tablet  2 times daily        07/27/20 0945    amoxicillin (AMOXIL) 500 MG capsule  3 times daily        07/27/20 0945           Tilden Fossa, MD 07/27/20 [phone]

## 2020-07-28 LAB — RPR: RPR Ser Ql: NONREACTIVE

## 2020-07-28 LAB — GC/CHLAMYDIA PROBE AMP (~~LOC~~) NOT AT ARMC
Chlamydia: NEGATIVE
Comment: NEGATIVE
Comment: NORMAL
Neisseria Gonorrhea: NEGATIVE

## 2020-11-23 ENCOUNTER — Encounter (HOSPITAL_BASED_OUTPATIENT_CLINIC_OR_DEPARTMENT_OTHER): Payer: Self-pay | Admitting: Emergency Medicine

## 2020-11-23 ENCOUNTER — Emergency Department (HOSPITAL_BASED_OUTPATIENT_CLINIC_OR_DEPARTMENT_OTHER)
Admission: EM | Admit: 2020-11-23 | Discharge: 2020-11-23 | Disposition: A | Discharge status: Home / Self Care | Payer: 59 | Attending: Emergency Medicine | Admitting: Emergency Medicine

## 2020-11-23 ENCOUNTER — Other Ambulatory Visit: Payer: Self-pay

## 2020-11-23 ENCOUNTER — Other Ambulatory Visit (HOSPITAL_BASED_OUTPATIENT_CLINIC_OR_DEPARTMENT_OTHER): Payer: Self-pay | Admitting: Emergency Medicine

## 2020-11-23 DIAGNOSIS — R2231 Localized swelling, mass and lump, right upper limb: Secondary | ICD-10-CM | POA: Insufficient documentation

## 2020-11-23 DIAGNOSIS — R197 Diarrhea, unspecified: Secondary | ICD-10-CM | POA: Insufficient documentation

## 2020-11-23 DIAGNOSIS — M7989 Other specified soft tissue disorders: Secondary | ICD-10-CM

## 2020-11-23 DIAGNOSIS — R202 Paresthesia of skin: Secondary | ICD-10-CM | POA: Insufficient documentation

## 2020-11-23 DIAGNOSIS — F17210 Nicotine dependence, cigarettes, uncomplicated: Secondary | ICD-10-CM | POA: Insufficient documentation

## 2020-11-23 LAB — CBC
HCT: 42.1 % (ref 36.0–46.0)
Hemoglobin: 13.8 g/dL (ref 12.0–15.0)
MCH: 27.2 pg (ref 26.0–34.0)
MCHC: 32.8 g/dL (ref 30.0–36.0)
MCV: 83 fL (ref 80.0–100.0)
Platelets: 343 10*3/uL (ref 150–400)
RBC: 5.07 MIL/uL (ref 3.87–5.11)
RDW: 14.4 % (ref 11.5–15.5)
WBC: 8.1 10*3/uL (ref 4.0–10.5)
nRBC: 0 % (ref 0.0–0.2)

## 2020-11-23 LAB — COMPREHENSIVE METABOLIC PANEL
ALT: 17 U/L (ref 0–44)
AST: 15 U/L (ref 15–41)
Albumin: 4 g/dL (ref 3.5–5.0)
Alkaline Phosphatase: 46 U/L (ref 38–126)
Anion gap: 9 (ref 5–15)
BUN: 22 mg/dL — ABNORMAL HIGH (ref 6–20)
CO2: 21 mmol/L — ABNORMAL LOW (ref 22–32)
Calcium: 8.5 mg/dL — ABNORMAL LOW (ref 8.9–10.3)
Chloride: 105 mmol/L (ref 98–111)
Creatinine, Ser: 0.85 mg/dL (ref 0.44–1.00)
GFR, Estimated: >60 mL/min (ref >60)
Glucose, Bld: 98 mg/dL (ref 70–99)
Potassium: 3.7 mmol/L (ref 3.5–5.1)
Sodium: 135 mmol/L (ref 135–145)
Total Bilirubin: 0.3 mg/dL (ref 0.3–1.2)
Total Protein: 6.7 g/dL (ref 6.5–8.1)

## 2020-11-23 MED ORDER — KETOROLAC TROMETHAMINE 30 MG/ML IJ SOLN
30.0000 mg | Freq: Once | INTRAMUSCULAR | Status: AC
  Administered 2020-11-23: 30 mg via INTRAMUSCULAR
  Filled 2020-11-23: qty 1

## 2020-11-23 MED ORDER — NAPROXEN 375 MG PO TABS: 375.0000 mg | ORAL_TABLET | Freq: Two times a day (BID) | ORAL | 0 refills | Status: DC

## 2020-11-23 MED FILL — NAPROXEN 375 MG TABLET: 375 | 10 days supply | Qty: 20 | Fill #0

## 2020-11-23 NOTE — ED Triage Notes (Signed)
Reports diarrhea and bil hand swelling with pain since yesterday.

## 2020-11-23 NOTE — Discharge Instructions (Addendum)
You are being prescribed naproxen. Do not take Ibuprofen/Advil/Aleve/Motrin/Goody Powders/BC powders/Meloxicam/Diclofenac/Indomethacin and other Nonsteroidal anti-inflammatory medications    You may still take Tylenol with the naproxen. You may also take Tylenol in addition to applying ice to your hand and elevating it.

## 2020-11-23 NOTE — ED Provider Notes (Signed)
[facility] EMERGENCY DEPARTMENT Provider Note   CSN: [id number] Arrival date & time: 11/23/20  1149     History Chief Complaint  Patient presents with  . hand swelling  . Diarrhea    Becky Green is a 35 y.o. female.  HPI 35 year old female presents with a chief complaint of right hand swelling. Started this morning. Yesterday her left hand was swollen that is improved but still feels a little bit of residual tingling but otherwise better. However her right hand is swollen and painful and hard to close. No injuries. She did wake up and have about 4 episodes of diarrhea without blood. No fevers. Took some ibuprofen yesterday with no significant relief. No previous history of joint problems. No wrist pain.  History reviewed. No pertinent past medical history.  There are no problems to display for this patient.   History reviewed. No pertinent surgical history.   OB History    Gravida  0   Para  0   Term  0   Preterm  0   AB  0   Living  0     SAB  0   IAB  0   Ectopic  0   Multiple  0   Live Births  0           Family History  Problem Relation Age of Onset  . Diabetes Mother     Social History   Tobacco Use  . Smoking status: Current Every Day Smoker    Packs/day: 1.00    Types: Cigarettes    Start date: 2000  . Smokeless tobacco: Never Used  Vaping Use  . Vaping Use: Never used  Substance Use Topics  . Alcohol use: Yes    Comment: occ  . Drug use: Yes    Types: Marijuana    Home Medications Prior to Admission medications   Medication Sig Start Date End Date Taking? Authorizing Provider  naproxen (NAPROSYN) 375 MG tablet Take 1 tablet (375 mg total) by mouth 2 (two) times daily. 11/23/20  Yes Pricilla Loveless, MD    Allergies    Patient has no known allergies.  Review of Systems   Review of Systems  Constitutional: Negative for fever.  Gastrointestinal: Positive for diarrhea. Negative for abdominal pain.   Musculoskeletal: Positive for arthralgias and joint swelling.    Physical Exam Updated Vital Signs BP 118/65 (BP Location: Right Arm)   Pulse 61   Temp 98.3 F (36.8 C) (Oral)   Resp 20   Ht 5' (1.524 m)   Wt 88.5 kg   SpO2 99%   BMI 38.08 kg/m   Physical Exam Vitals and nursing note reviewed.  Constitutional:      Appearance: She is well-developed and well-nourished.  HENT:     Head: Normocephalic and atraumatic.     Right Ear: External ear normal.     Left Ear: External ear normal.     Nose: Nose normal.  Eyes:     General:        Right eye: No discharge.        Left eye: No discharge.  Cardiovascular:     Rate and Rhythm: Normal rate and regular rhythm.     Pulses:          Radial pulses are 2+ on the right side and 2+ on the left side.  Pulmonary:     Effort: Pulmonary effort is normal.  Abdominal:     Palpations: Abdomen is soft.  Tenderness: There is no abdominal tenderness.  Musculoskeletal:     Comments: Left hand appears normal on visualization. No significant tenderness and normal range of motion of the hand. Normal strength/sensation. Right hand appears diffusely swollen on the distal half of the hand as well as the fingers. No warmth or erythema. Can range her hand but it is painful. No wrist pain or swelling.   Skin:    General: Skin is warm and dry.  Neurological:     Mental Status: She is alert.  Psychiatric:        Mood and Affect: Mood is not anxious.     ED Results / Procedures / Treatments   Labs (all labs ordered are listed, but only abnormal results are displayed) Labs Reviewed  COMPREHENSIVE METABOLIC PANEL - Abnormal; Notable for the following components:      Result Value   CO2 21 (*)    BUN 22 (*)    Calcium 8.5 (*)    All other components within normal limits  CBC    EKG None  Radiology No results found.  Procedures Procedures   Medications Ordered in ED Medications  ketorolac (TORADOL) 30 MG/ML injection 30 mg  (has no administration in time range)    ED Course  I have reviewed the triage vital signs and the nursing notes.  Pertinent labs & imaging results that were available during my care of the patient were reviewed by me and considered in my medical decision making (see chart for details).    MDM Rules/Calculators/A&P                          No clear cause of the patient's right hand swelling. At this point, I do not think x-ray would be particularly helpful given no trauma. I did offer her x-ray but she declines. We will try Toradol and naproxen. If this improves like her left hand then I think she can follow-up with her PCP. Otherwise will refer to sports medicine. Unclear relationship to the diarrhea that she had this morning but otherwise her labs are fairly unremarkable including normal WBC and significant electrolytes. Abdominal exam is benign. Final Clinical Impression(s) / ED Diagnoses Final diagnoses:  Swelling of right hand    Rx / DC Orders ED Discharge Orders         Ordered    naproxen (NAPROSYN) 375 MG tablet  2 times daily        11/23/20 1522           Pricilla Loveless, MD 11/23/20 1527

## 2020-11-28 ENCOUNTER — Other Ambulatory Visit: Payer: Self-pay

## 2020-11-28 ENCOUNTER — Encounter (HOSPITAL_BASED_OUTPATIENT_CLINIC_OR_DEPARTMENT_OTHER): Payer: Self-pay

## 2020-11-28 ENCOUNTER — Other Ambulatory Visit (HOSPITAL_BASED_OUTPATIENT_CLINIC_OR_DEPARTMENT_OTHER): Payer: Self-pay | Admitting: Student

## 2020-11-28 ENCOUNTER — Emergency Department (HOSPITAL_BASED_OUTPATIENT_CLINIC_OR_DEPARTMENT_OTHER)
Admission: EM | Admit: 2020-11-28 | Discharge: 2020-11-28 | Disposition: A | Discharge status: Home / Self Care | Payer: Medicaid Other | Attending: Emergency Medicine | Admitting: Emergency Medicine

## 2020-11-28 DIAGNOSIS — M79641 Pain in right hand: Secondary | ICD-10-CM | POA: Insufficient documentation

## 2020-11-28 DIAGNOSIS — F17210 Nicotine dependence, cigarettes, uncomplicated: Secondary | ICD-10-CM | POA: Insufficient documentation

## 2020-11-28 DIAGNOSIS — M79642 Pain in left hand: Secondary | ICD-10-CM

## 2020-11-28 MED ORDER — PREDNISONE 20 MG PO TABS: 20.0000 mg | ORAL_TABLET | Freq: Every day | ORAL | 0 refills | Status: DC

## 2020-11-28 MED FILL — predniSONE 20 MG TABS: 20 | 5 days supply | Qty: 5 | Fill #0

## 2020-11-28 NOTE — ED Triage Notes (Signed)
Pt arrives with continued hand pain and swelling states that she was recently seen here for the same and given Naproxen but states that it is not getting better.

## 2020-11-28 NOTE — Discharge Instructions (Addendum)
seen here for hand pain.  Vital signs and exam look reassuring.  I have given you hand splints please wear at nighttime.  I have also given you a prescription for steroids please take as prescribed.  Continue taking your naproxen as prescribed.  I would like you to follow-up with hand surgery for further evaluation.  Come back to the emergency department if you develop chest pain, shortness of breath, severe abdominal pain, uncontrolled nausea, vomiting, diarrhea.

## 2020-11-28 NOTE — ED Provider Notes (Signed)
[facility] EMERGENCY DEPARTMENT Provider Note   CSN: [id number] Arrival date & time: 11/28/20  2992     History Chief Complaint  Patient presents with  . Hand Pain    Becky Green is a 35 y.o. female.  HPI   Patient with no significant medical history present with chief complaint of bilateral hand pain.  Patient endorses that she has had hand pain that comes and goes for the last year, she endorses the pain is mainly in her fingers.  She describes the pain as a throbbing sensation in her fingers and sometimes it feels numb.  She denies recent trauma to the area, denies autoimmune diseases, or IV drug use.  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  She denies any alleviating factors, she has been taking NSAIDs without any relief.  Patient denies headaches, fevers, chills, shortness of breath, chest pain, abdominal pain, nausea, vomiting, diarrhea, pedal edema.  History reviewed. No pertinent past medical history.  There are no problems to display for this patient.   History reviewed. No pertinent surgical history.   OB History    Gravida  0   Para  0   Term  0   Preterm  0   AB  0   Living  0     SAB  0   IAB  0   Ectopic  0   Multiple  0   Live Births  0           Family History  Problem Relation Age of Onset  . Diabetes Mother     Social History   Tobacco Use  . Smoking status: Current Every Day Smoker    Packs/day: 1.00    Types: Cigarettes    Start date: 2000  . Smokeless tobacco: Never Used  Vaping Use  . Vaping Use: Never used  Substance Use Topics  . Alcohol use: Yes    Comment: occ  . Drug use: Yes    Types: Marijuana    Home Medications Prior to Admission medications   Medication Sig Start Date End Date Taking? Authorizing Provider  predniSONE (DELTASONE) 20 MG tablet  Take 1 tablet (20 mg total) by mouth daily for 5 days. 11/28/20 12/03/20 Yes Carroll Sage, PA-C  naproxen (NAPROSYN) 375 MG tablet Take 1 tablet (375 mg total) by mouth 2 (two) times daily. 11/23/20   Pricilla Loveless, MD    Allergies    Patient has no known allergies.  Review of Systems   Review of Systems  Constitutional: Negative for chills and fever.  HENT: Negative for congestion.   Respiratory: Negative for shortness of breath.   Cardiovascular: Negative for chest pain.  Gastrointestinal: Negative for abdominal pain, diarrhea, nausea and vomiting.  Genitourinary: Negative for enuresis.  Musculoskeletal: Negative for back pain.       Bilateral hand pain.  Skin: Negative for rash and wound.  Neurological: Negative for dizziness.  Hematological: Does not bruise/bleed easily.    Physical Exam Updated Vital Signs BP 118/73 (BP Location: Left Arm)   Pulse 83   Temp 98.3 F (36.8 C) (Oral)   Resp 16   Ht 5' (1.524 m)   Wt 90.7 kg   LMP 11/14/2020   SpO2 99%   BMI 39.06 kg/m   Physical Exam Vitals and nursing note  reviewed.  Constitutional:      General: She is not in acute distress.    Appearance: She is not ill-appearing.  HENT:     Head: Normocephalic and atraumatic.     Nose: No congestion.  Eyes:     Conjunctiva/sclera: Conjunctivae normal.  Cardiovascular:     Rate and Rhythm: Normal rate and regular rhythm.     Pulses: Normal pulses.     Heart sounds: No murmur heard. No friction rub. No gallop.   Pulmonary:     Effort: No respiratory distress.     Breath sounds: No wheezing, rhonchi or rales.  Musculoskeletal:        General: Tenderness present. No swelling or deformity.     Comments: Patient's hands were visualized, there is no gross abnormalities noted, no edema, erythema, paresthesia, ecchymosis, lacerations or abrasions.  Patient had full range of motion in all of her joints within her fingers and wrist bilaterally.  She had slight decrease grip  strength on the right hand, neurovascular fully intact.  Positive Tinel's sign, positive phalen sign  Skin:    General: Skin is warm and dry.     Capillary Refill: Capillary refill takes less than 2 seconds.  Neurological:     Mental Status: She is alert.  Psychiatric:        Mood and Affect: Mood normal.     ED Results / Procedures / Treatments   Labs (all labs ordered are listed, but only abnormal results are displayed) Labs Reviewed - No data to display  EKG None  Radiology No results found.  Procedures Procedures   Medications Ordered in ED Medications - No data to display  ED Course  I have reviewed the triage vital signs and the nursing notes.  Pertinent labs & imaging results that were available during my care of the patient were reviewed by me and considered in my medical decision making (see chart for details).    MDM Rules/Calculators/A&P                          Initial impression-patient presents with bilateral hand pain.  She is alert, does not appear acute distress, vital signs reassuring.  Work-up-due to well-appearing patient, benign physical exam further lab and imaging not warranted at this time.  Rule out- I have low suspicion for septic arthritis as patient denies IV drug use, skin exam was performed no erythematous, edematous, warm joints noted on exam, no new heart murmur heard on exam.  Low suspicion for fracture or dislocation as patient denies recent trauma to the area, she has full range of motion in her hands, no gross deformities present.  Low suspicion for ligament or tendon damage as area was palpated no gross defects noted,  she had full range of motion in her fingers and wrists bilaterally.  Low suspicion for cellulitis as there is no skin change on my exam, hands were not warm to the touch, no signs of injury present.  Low suspicion for compartment syndrome as area was palpated it was soft to the touch, neurovascular fully intact.   Plan-  Tinel's sign, positive phalen sign and history is consistent with carpal tunnel will place patient in a splint and have her wear it at nighttime.  We will provide her with steroids recommend over-the-counter pain medications.  Patient should follow-up with hand surgery for further evaluation.  Vital signs have remained stable, no indication for hospital admission.  Patient discussed  with attending and they agreed with assessment and plan.  Patient given at home care as well strict return precautions.  Patient verbalized that they understood agreed to said plan.   Final Clinical Impression(s) / ED Diagnoses Final diagnoses:  Pain in both hands    Rx / DC Orders ED Discharge Orders         Ordered    predniSONE (DELTASONE) 20 MG tablet  Daily        11/28/20 1056           Barnie Del 11/28/20 1108    Terrilee Files, MD 11/28/20 1729

## 2021-05-15 ENCOUNTER — Encounter (HOSPITAL_BASED_OUTPATIENT_CLINIC_OR_DEPARTMENT_OTHER): Payer: Self-pay

## 2021-05-15 ENCOUNTER — Emergency Department (HOSPITAL_BASED_OUTPATIENT_CLINIC_OR_DEPARTMENT_OTHER)
Admission: EM | Admit: 2021-05-15 | Discharge: 2021-05-15 | Disposition: A | Discharge status: Home / Self Care | Payer: 59 | Attending: Emergency Medicine | Admitting: Emergency Medicine

## 2021-05-15 ENCOUNTER — Emergency Department (HOSPITAL_BASED_OUTPATIENT_CLINIC_OR_DEPARTMENT_OTHER): Payer: 59

## 2021-05-15 ENCOUNTER — Other Ambulatory Visit: Payer: Self-pay

## 2021-05-15 DIAGNOSIS — H538 Other visual disturbances: Secondary | ICD-10-CM | POA: Insufficient documentation

## 2021-05-15 DIAGNOSIS — R519 Headache, unspecified: Secondary | ICD-10-CM | POA: Diagnosis not present

## 2021-05-15 DIAGNOSIS — F17210 Nicotine dependence, cigarettes, uncomplicated: Secondary | ICD-10-CM | POA: Diagnosis not present

## 2021-05-15 DIAGNOSIS — R0781 Pleurodynia: Secondary | ICD-10-CM | POA: Diagnosis not present

## 2021-05-15 DIAGNOSIS — R059 Cough, unspecified: Secondary | ICD-10-CM | POA: Diagnosis present

## 2021-05-15 MED ORDER — IBUPROFEN 400 MG PO TABS
600.0000 mg | ORAL_TABLET | Freq: Once | ORAL | Status: AC
  Administered 2021-05-15: 600 mg via ORAL
  Filled 2021-05-15: qty 1

## 2021-05-15 NOTE — ED Provider Notes (Signed)
[facility] EMERGENCY DEPARTMENT Provider Note   CSN: [id number] Arrival date & time: 05/15/21  1701     History Chief Complaint  Patient presents with   Cough    Becky Green is a 35 y.o. female.  Patient reports persistent dry cough that started yesterday while using cleaning products while cleaning a house.  She states that she was using Clorox in a spray bottle and another cleaner which she does not know the name of.  She also developed blurred vision and watery eyes, which have since resolved.  She also reports mild left-sided headache and bilateral lower rib pain which she attributes to coughing.  After symptoms started, she took some NyQuil and tried to sleep it off.  She has not tried any medications for her cough or rib pain.  Patient reports she still has cough though improved from yesterday and still has rib pain.  Denies fever, chills, sore throat, congestion, rhinorrhea, shortness of breath, chest pain, rash.  She does not think she has COVID and declines testing.  The history is provided by the patient. No language interpreter was used.  Cough Cough characteristics:  Dry Onset quality:  Sudden Duration:  2 days Progression:  Improving Chronicity:  New Context: occupational exposure   Context: not sick contacts and not upper respiratory infection   Relieved by:  None tried Worsened by:  Nothing Ineffective treatments:  None tried Associated symptoms: eye discharge and headaches   Associated symptoms: no chest pain, no chills, no fever, no rash, no shortness of breath, no sinus congestion and no sore throat   Risk factors: chemical exposure       History reviewed. No pertinent past medical history.  There are no problems to display for this patient.   History reviewed. No pertinent surgical history.   OB History     Gravida  0   Para  0   Term  0   Preterm  0   AB  0   Living  0      SAB  0   IAB  0   Ectopic  0   Multiple  0    Live Births  0           Family History  Problem Relation Age of Onset   Diabetes Mother     Social History   Tobacco Use   Smoking status: Every Day    Packs/day: 1.00    Types: Cigarettes    Start date: 2000   Smokeless tobacco: Never  Vaping Use   Vaping Use: Never used  Substance Use Topics   Alcohol use: Yes    Comment: occ   Drug use: Yes    Types: Marijuana    Home Medications Prior to Admission medications   Medication Sig Start Date End Date Taking? Authorizing Provider  naproxen (NAPROSYN) 375 MG tablet TAKE 1 TABLET BY MOUTH TWICE DAILY 11/23/20 11/23/21  Pricilla Loveless, MD  predniSONE (DELTASONE) 20 MG tablet TAKE 1 TABLET BY MOUTH DAILY FOR 5 DAYS 11/28/20 11/28/21  Carroll Sage, PA-C    Allergies    Patient has no known allergies.  Review of Systems   Review of Systems  Constitutional:  Negative for chills, fatigue and fever.  HENT:  Negative for congestion and sore throat.   Eyes:  Positive for discharge and visual disturbance.  Respiratory:  Positive for cough. Negative for shortness of breath.   Cardiovascular:  Negative for chest pain.  Gastrointestinal:  Negative for abdominal pain.  Musculoskeletal:        Rib pain  Skin:  Negative for rash.  Neurological:  Positive for headaches.   Physical Exam Updated Vital Signs BP 136/81 (BP Location: Left Arm)   Pulse 82   Temp 98.9 F (37.2 C) (Oral)   Resp 16   Ht 4\' 11"  (1.499 m)   Wt 78.9 kg   LMP 05/02/2021   SpO2 99%   BMI 35.14 kg/m   Physical Exam Vitals and nursing note reviewed.  Constitutional:      General: She is not in acute distress.    Appearance: She is well-developed.  HENT:     Head: Normocephalic and atraumatic.     Mouth/Throat:     Mouth: Mucous membranes are moist.     Pharynx: Oropharynx is clear.  Eyes:     Extraocular Movements: Extraocular movements intact.     Conjunctiva/sclera: Conjunctivae normal.     Pupils: Pupils are equal, round, and  reactive to light.  Cardiovascular:     Rate and Rhythm: Normal rate and regular rhythm.     Heart sounds: No murmur heard. Pulmonary:     Effort: Pulmonary effort is normal. No respiratory distress.     Breath sounds: Normal breath sounds.  Abdominal:     Palpations: Abdomen is soft.     Tenderness: There is no abdominal tenderness.  Musculoskeletal:        General: Tenderness present.     Cervical back: Neck supple.     Comments: Mild tenderness to palpation bilateral lower ribs.  Skin:    General: Skin is warm and dry.     Findings: No rash.  Neurological:     General: No focal deficit present.     Mental Status: She is alert.    ED Results / Procedures / Treatments   Labs (all labs ordered are listed, but only abnormal results are displayed) Labs Reviewed - No data to display  EKG None  Radiology DG [facility] 1 View  Result Date: 05/15/2021 CLINICAL DATA:  Rib pain cough EXAM: PORTABLE CHEST 1 VIEW COMPARISON:  08/11/2018 FINDINGS: The heart size and mediastinal contours are within normal limits. Both lungs are clear. The visualized skeletal structures are unremarkable. IMPRESSION: No active disease. Electronically Signed   By: 08/13/2018 M.D.   On: 05/15/2021 19:39    Procedures Procedures   Medications Ordered in ED Medications  ibuprofen (ADVIL) tablet 600 mg (600 mg Oral Given 05/15/21 1915)    ED Course  I have reviewed the triage vital signs and the nursing notes.  Pertinent labs & imaging results that were available during my care of the patient were reviewed by me and considered in my medical decision making (see chart for details).    MDM Rules/Calculators/A&P                         35 year old female with no significant past medical history presenting with dry cough and rib pain in the setting of chemical exposure.  No shortness of breath and normal lung exam.  Possible inhalation injury based on timeline of symptoms and known exposure.  VSS, less  likely to have lung involvement. Reassuringly symptoms are improving. Symptoms less likely due to viral illness based on history, patient declined COVID testing.  Will order CXR given rib pain to rule out fracture and pneumothorax.  Ordering ibuprofen for rib pain.  CXR unremarkable. Patient stable  for discharge at this time, return precautions given.   Final Clinical Impression(s) / ED Diagnoses Final diagnoses:  None    Rx / DC Orders ED Discharge Orders     None       Littie Deeds, MD  PGY-2   Littie Deeds, MD 05/15/21 2017    Melene Plan, DO 05/15/21 2251

## 2021-05-15 NOTE — ED Triage Notes (Signed)
Pt c/o dry cough, sinus congestion started yesterday after using cleaning products-NAD-steady gait

## 2021-05-15 NOTE — Discharge Instructions (Addendum)
You can continue to take ibuprofen or Tylenol as needed for rib pain. Your chest X-ray looks normal.  Please return if you develop any shortness of breath or feel like your symptoms are getting worse.

## 2021-06-13 ENCOUNTER — Emergency Department (HOSPITAL_BASED_OUTPATIENT_CLINIC_OR_DEPARTMENT_OTHER)
Admission: EM | Admit: 2021-06-13 | Discharge: 2021-06-13 | Disposition: A | Discharge status: Home / Self Care | Payer: 59 | Attending: Emergency Medicine | Admitting: Emergency Medicine

## 2021-06-13 ENCOUNTER — Encounter (HOSPITAL_BASED_OUTPATIENT_CLINIC_OR_DEPARTMENT_OTHER): Payer: Self-pay

## 2021-06-13 ENCOUNTER — Other Ambulatory Visit: Payer: Self-pay

## 2021-06-13 ENCOUNTER — Emergency Department (HOSPITAL_BASED_OUTPATIENT_CLINIC_OR_DEPARTMENT_OTHER): Payer: 59

## 2021-06-13 ENCOUNTER — Other Ambulatory Visit (HOSPITAL_BASED_OUTPATIENT_CLINIC_OR_DEPARTMENT_OTHER): Payer: Self-pay

## 2021-06-13 DIAGNOSIS — R1084 Generalized abdominal pain: Secondary | ICD-10-CM | POA: Diagnosis not present

## 2021-06-13 DIAGNOSIS — K5900 Constipation, unspecified: Secondary | ICD-10-CM | POA: Insufficient documentation

## 2021-06-13 DIAGNOSIS — R350 Frequency of micturition: Secondary | ICD-10-CM | POA: Insufficient documentation

## 2021-06-13 DIAGNOSIS — F17210 Nicotine dependence, cigarettes, uncomplicated: Secondary | ICD-10-CM | POA: Diagnosis not present

## 2021-06-13 DIAGNOSIS — R300 Dysuria: Secondary | ICD-10-CM | POA: Diagnosis not present

## 2021-06-13 DIAGNOSIS — N898 Other specified noninflammatory disorders of vagina: Secondary | ICD-10-CM | POA: Diagnosis not present

## 2021-06-13 LAB — COMPREHENSIVE METABOLIC PANEL
ALT: 15 U/L (ref 0–44)
AST: 14 U/L — ABNORMAL LOW (ref 15–41)
Albumin: 4.4 g/dL (ref 3.5–5.0)
Alkaline Phosphatase: 49 U/L (ref 38–126)
Anion gap: 10 (ref 5–15)
BUN: 22 mg/dL — ABNORMAL HIGH (ref 6–20)
CO2: 20 mmol/L — ABNORMAL LOW (ref 22–32)
Calcium: 9.6 mg/dL (ref 8.9–10.3)
Chloride: 103 mmol/L (ref 98–111)
Creatinine, Ser: 0.89 mg/dL (ref 0.44–1.00)
GFR, Estimated: >60 mL/min (ref >60)
Glucose, Bld: 88 mg/dL (ref 70–99)
Potassium: 4.2 mmol/L (ref 3.5–5.1)
Sodium: 133 mmol/L — ABNORMAL LOW (ref 135–145)
Total Bilirubin: 0.2 mg/dL — ABNORMAL LOW (ref 0.3–1.2)
Total Protein: 7.5 g/dL (ref 6.5–8.1)

## 2021-06-13 LAB — CBC WITH DIFFERENTIAL/PLATELET
Abs Immature Granulocytes: 0.04 10*3/uL (ref 0.00–0.07)
Basophils Absolute: 0.1 10*3/uL (ref 0.0–0.1)
Basophils Relative: 1 %
Eosinophils Absolute: 0.2 10*3/uL (ref 0.0–0.5)
Eosinophils Relative: 2 %
HCT: 47.1 % — ABNORMAL HIGH (ref 36.0–46.0)
Hemoglobin: 15.4 g/dL — ABNORMAL HIGH (ref 12.0–15.0)
Immature Granulocytes: 0 %
Lymphocytes Relative: 29 %
Lymphs Abs: 2.9 10*3/uL (ref 0.7–4.0)
MCH: 27.5 pg (ref 26.0–34.0)
MCHC: 32.7 g/dL (ref 30.0–36.0)
MCV: 84 fL (ref 80.0–100.0)
Monocytes Absolute: 0.7 10*3/uL (ref 0.1–1.0)
Monocytes Relative: 7 %
Neutro Abs: 6.1 10*3/uL (ref 1.7–7.7)
Neutrophils Relative %: 61 %
Platelets: 310 10*3/uL (ref 150–400)
RBC: 5.61 MIL/uL — ABNORMAL HIGH (ref 3.87–5.11)
RDW: 14.5 % (ref 11.5–15.5)
WBC: 9.9 10*3/uL (ref 4.0–10.5)
nRBC: 0 % (ref 0.0–0.2)

## 2021-06-13 LAB — URINALYSIS, ROUTINE W REFLEX MICROSCOPIC
Bilirubin Urine: NEGATIVE
Glucose, UA: NEGATIVE mg/dL
Hgb urine dipstick: NEGATIVE
Ketones, ur: 15 mg/dL — AB
Leukocytes,Ua: NEGATIVE
Nitrite: NEGATIVE
Protein, ur: NEGATIVE mg/dL
Specific Gravity, Urine: 1.03 (ref 1.005–1.030)
pH: 6 (ref 5.0–8.0)

## 2021-06-13 MED ORDER — DOCUSATE SODIUM 100 MG PO CAPS
100.0000 mg | ORAL_CAPSULE | Freq: Two times a day (BID) | ORAL | 0 refills | Status: AC | PRN
  Filled 2021-06-13: qty 100, 50d supply, fill #0

## 2021-06-13 MED ORDER — IOHEXOL 350 MG/ML SOLN
100.0000 mL | Freq: Once | INTRAVENOUS | Status: AC | PRN
  Administered 2021-06-13: 85 mL via INTRAVENOUS

## 2021-06-13 MED ORDER — ACETAMINOPHEN 325 MG PO TABS
650.0000 mg | ORAL_TABLET | Freq: Once | ORAL | Status: AC
  Administered 2021-06-13: 650 mg via ORAL
  Filled 2021-06-13: qty 2

## 2021-06-13 MED ORDER — POLYETHYLENE GLYCOL 3350 17 GM/SCOOP PO POWD
ORAL | 0 refills | Status: AC
  Filled 2021-06-13: qty 238, 5d supply, fill #0

## 2021-06-13 MED ORDER — POLYETHYLENE GLYCOL 3350 17 GM/SCOOP PO POWD
1.0000 | Freq: Once | ORAL | 0 refills | Status: DC
  Filled 2021-06-13: qty 255, 1d supply, fill #0

## 2021-06-13 NOTE — ED Notes (Signed)
Patient transported to CT 

## 2021-06-13 NOTE — ED Triage Notes (Signed)
Pt states had a sharp pain in left flank and now complaining of generalized abdominal pain. Denies N/V/D. C/o constipation. Last normal BM 1 week ago. Recently put on steroids.

## 2021-06-13 NOTE — Discharge Instructions (Addendum)
Take Colace up to twice a day to help soften your stool. Use MiraLAX, 1-2 capfuls in 8 ounces of liquid up to 3 times a day until you are having regular bowel movements. Make sure stay well-hydrated water. Eat a high-fiber diet to help with your constipation.  There is information about this in the paperwork. Follow-up with primary care doctor as needed for recheck of your symptoms. Return to the emergency room if you develop high fevers, persistent vomiting, severe worsening pain, or any new, worsening, or concerning symptoms.

## 2021-06-13 NOTE — ED Provider Notes (Signed)
[facility] EMERGENCY DEPARTMENT Provider Note   CSN: [id number] Arrival date & time: 06/13/21  1013     History Chief Complaint  Patient presents with   Abdominal Pain    Becky Green is a 35 y.o. female presenting for evaluation of abdominal pain.  Patient states earlier this morning she had sharp left flank pain.  Immediately following this, she had generalized abdominal discomfort, worse in the epigastric and suprapubic region.  She has not had any sharp pain since.  She reports associated dysuria and urinary frequency, no hematuria.  She has also had issues with harder than normal stool for the past week, though she is still passing gas and passing small stool balls.  She is not on any pain medication.  She denies fevers, chills, chest pain, shortness of breath, cough, nausea, vomiting.  No previous abdominal surgeries.  No history of similar.  She does have some mild vaginal discharge, but states this is normal for her since finishing her period last week.  She has no medical problems, takes no medications daily.  She did recently finish a course of prednisone for carpal tunnel.  HPI     History reviewed. No pertinent past medical history.  There are no problems to display for this patient.   History reviewed. No pertinent surgical history.   OB History     Gravida  0   Para  0   Term  0   Preterm  0   AB  0   Living  0      SAB  0   IAB  0   Ectopic  0   Multiple  0   Live Births  0           Family History  Problem Relation Age of Onset   Diabetes Mother     Social History   Tobacco Use   Smoking status: Every Day    Packs/day: 1.00    Types: Cigarettes    Start date: 2000   Smokeless tobacco: Never  Vaping Use   Vaping Use: Never used  Substance Use Topics   Alcohol use: Yes    Comment: occ   Drug use: Yes    Types: Marijuana    Home Medications Prior to Admission medications   Medication Sig Start Date End Date  Taking? Authorizing Provider  docusate sodium (COLACE) 100 MG capsule Take 1 capsule (100 mg total) by mouth 2 (two) times daily as needed for mild constipation. 06/13/21  Yes Ashyla Luth, PA-C  naproxen (NAPROSYN) 375 MG tablet TAKE 1 TABLET BY MOUTH TWICE DAILY 11/23/20 11/23/21  Pricilla Loveless, MD  polyethylene glycol powder (MIRALAX) 17 GM/SCOOP powder Use 1-2 capfuls in 8 oz water up to 3 times a day until having regular bowel movements. 06/13/21   Montravious Weigelt, PA-C  predniSONE (DELTASONE) 20 MG tablet TAKE 1 TABLET BY MOUTH DAILY FOR 5 DAYS 11/28/20 11/28/21  Carroll Sage, PA-C    Allergies    Patient has no known allergies.  Review of Systems   Review of Systems  Gastrointestinal:  Positive for abdominal pain and constipation.  Genitourinary:  Positive for dysuria, flank pain and frequency.  All other systems reviewed and are negative.  Physical Exam Updated Vital Signs BP (!) 147/91   Pulse 70   Temp 97.9 F (36.6 C) (Oral)   Resp 16   Ht 4\' 11"  (1.499 m)   Wt 79.4 kg   LMP 06/05/2021 (Approximate)  SpO2 100%   BMI 35.35 kg/m   Physical Exam Vitals and nursing note reviewed.  Constitutional:      General: She is not in acute distress.    Appearance: Normal appearance.     Comments: Resting in the bed in no acute distress  HENT:     Head: Normocephalic and atraumatic.  Eyes:     Conjunctiva/sclera: Conjunctivae normal.     Pupils: Pupils are equal, round, and reactive to light.  Cardiovascular:     Rate and Rhythm: Normal rate and regular rhythm.     Pulses: Normal pulses.  Pulmonary:     Effort: Pulmonary effort is normal. No respiratory distress.     Breath sounds: Normal breath sounds. No wheezing.     Comments: Speaking in full sentences.  Clear lung sounds in all fields. Abdominal:     General: There is no distension.     Palpations: Abdomen is soft. There is no mass.     Tenderness: There is abdominal tenderness. There is no guarding or  rebound.     Comments: Diffuse tenderness palpation of the abdomen.  No rigidity, guarding, distention.  Negative rebound.  No peritonitis.  No CVA tenderness.  Musculoskeletal:        General: Normal range of motion.     Cervical back: Normal range of motion and neck supple.  Skin:    General: Skin is warm and dry.     Capillary Refill: Capillary refill takes less than 2 seconds.  Neurological:     Mental Status: She is alert and oriented to person, place, and time.  Psychiatric:        Mood and Affect: Mood and affect normal.        Speech: Speech normal.        Behavior: Behavior normal.    ED Results / Procedures / Treatments   Labs (all labs ordered are listed, but only abnormal results are displayed) Labs Reviewed  CBC WITH DIFFERENTIAL/PLATELET - Abnormal; Notable for the following components:      Result Value   RBC 5.61 (*)    Hemoglobin 15.4 (*)    HCT 47.1 (*)    All other components within normal limits  COMPREHENSIVE METABOLIC PANEL - Abnormal; Notable for the following components:   Sodium 133 (*)    CO2 20 (*)    BUN 22 (*)    AST 14 (*)    Total Bilirubin 0.2 (*)    All other components within normal limits  URINALYSIS, ROUTINE W REFLEX MICROSCOPIC - Abnormal; Notable for the following components:   Ketones, ur 15 (*)    All other components within normal limits    EKG None  Radiology CT ABDOMEN PELVIS W CONTRAST  Result Date: 06/13/2021 CLINICAL DATA:  Abdominal pain and constipation EXAM: CT ABDOMEN AND PELVIS WITH CONTRAST TECHNIQUE: Multidetector CT imaging of the abdomen and pelvis was performed using the standard protocol following bolus administration of intravenous contrast. CONTRAST:  47mL OMNIPAQUE IOHEXOL 350 MG/ML SOLN COMPARISON:  CT September 04, 2019 FINDINGS: Lower chest: No acute abnormality.  Small hiatal hernia. Hepatobiliary: No suspicious hepatic lesion. Gallbladder is unremarkable. No biliary ductal dilation. Pancreas: Unremarkable. No  pancreatic ductal dilatation or surrounding inflammatory changes. Spleen: Normal in size without focal abnormality. Adrenals/Urinary Tract: Adrenal glands are unremarkable. Kidneys are normal, without renal calculi, solid enhancing lesion, or hydronephrosis. Bladder is unremarkable for degree of distension. Stomach/Bowel: Small hiatal hernia otherwise the stomach is within normal limits. Appendix  appears normal. No evidence of bowel wall thickening, distention, or inflammatory changes. Vascular/Lymphatic: Aortic atherosclerosis without aneurysmal dilation. No pathologically enlarged abdominal or pelvic lymph nodes. Reproductive: Uterus is unremarkable. Corpus luteal cyst in the right ovary. Similar appearance of the 2.9 cm left ovarian cysts and adjacent dilated tubular structure dating back to CT September 04, 2019 and pelvic ultrasound September 03, 2018. Other: Trace pelvic free fluid, likely physiologic. No walled off fluid collections. No pneumoperitoneum. Musculoskeletal: No acute or significant osseous findings. IMPRESSION: 1. No acute abdominopelvic findings. 2. Moderate volume of formed stool in the ascending and transverse colon, possibly reflecting constipation. 3. Simple appearing of the 2.9 cm left ovarian cyst and adjacent dilated tubular structure appears similar dating back to at least CT September 04, 2019 and pelvic ultrasound September 03, 2018,. Findings likely reflecting an ovarian cyst and hydrosalpinx. 4. Small hiatal hernia. 5.  Aortic Atherosclerosis (ICD10-I70.0). Electronically Signed   By: Maudry Mayhew M.D.   On: 06/13/2021 13:15    Procedures Procedures   Medications Ordered in ED Medications  acetaminophen (TYLENOL) tablet 650 mg (650 mg Oral Given 06/13/21 1219)  iohexol (OMNIPAQUE) 350 MG/ML injection 100 mL (85 mLs Intravenous Contrast Given 06/13/21 1232)    ED Course  I have reviewed the triage vital signs and the nursing notes.  Pertinent labs & imaging results that were  available during my care of the patient were reviewed by me and considered in my medical decision making (see chart for details).    MDM Rules/Calculators/A&P                           Patient presenting for evaluation of abdominal pain.  On exam, patient peers nontoxic.  No flank pain or sharp pain currently, and says she has diffuse generalized abdominal pain.  She is also reporting urinary symptoms, consider UTI versus Pyelo.  Also consider kidney stone.  Consider constipation due to patient's harder than normal stool this past week.  Consider viral GI illness.  Consider diverticulitis. Will obtain labs.  CT to rule out kidney stone versus Pyelo versus diverticulitis.  Labs interpreted by me, overall reassuring.  Urine without signs of infection.  CT abdomen pelvis consistent with constipation, otherwise no acute abnormalities.  Discussed findings with patient.  Discussed symptomatic management with stool softeners and MiraLAX.  Discussed importance of increasing fluid and fiber intake.  At this time, patient appears safe for discharge.  Return precautions given.  Patient states she understands and agrees to plan.  Final Clinical Impression(s) / ED Diagnoses Final diagnoses:  Constipation, unspecified constipation type    Rx / DC Orders ED Discharge Orders          Ordered    polyethylene glycol powder (MIRALAX) 17 GM/SCOOP powder   Once,   Status:  Discontinued        06/13/21 1422    docusate sodium (COLACE) 100 MG capsule  2 times daily PRN        06/13/21 1422    polyethylene glycol powder (MIRALAX) 17 GM/SCOOP powder        06/13/21 1423             Jacquelene Kopecky, PA-C 06/13/21 1554    Derwood Kaplan, MD 06/14/21 [phone]

## 2021-06-21 IMAGING — US ULTRASOUND ABDOMEN LIMITED
1 series · 14 of 25 positions shown · non-contrast
Comparison: None.

CLINICAL DATA: RUQ pain, N/V x 3 days; patient states she hasn't
ate since this morning and she threw that up, had some ginger ale a
couple hrs ago CT, 09/03/2018

EXAM:
ULTRASOUND ABDOMEN LIMITED RIGHT UPPER QUADRANT

[Series 1: ultrasound abdomen limited · 14 of 44 slices shown]
[im 1/44]
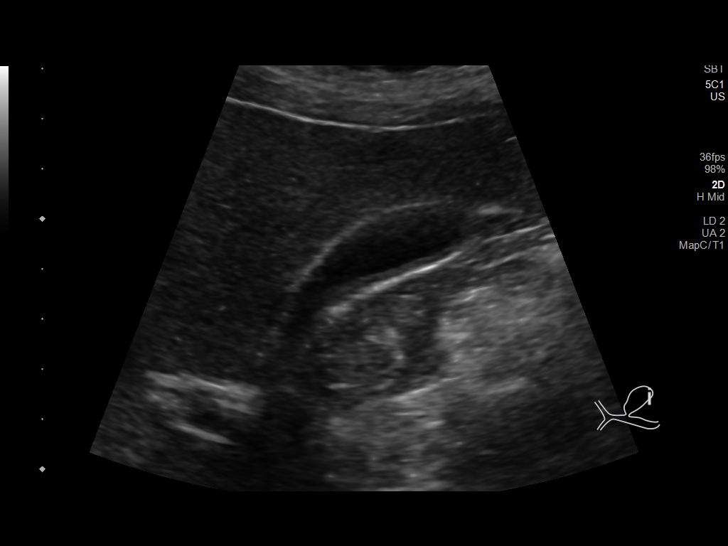
[im 4/44]
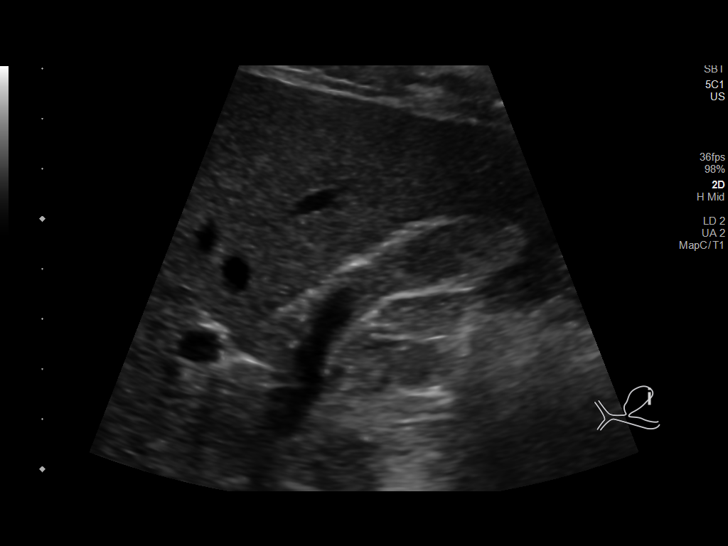
[im 8/44]
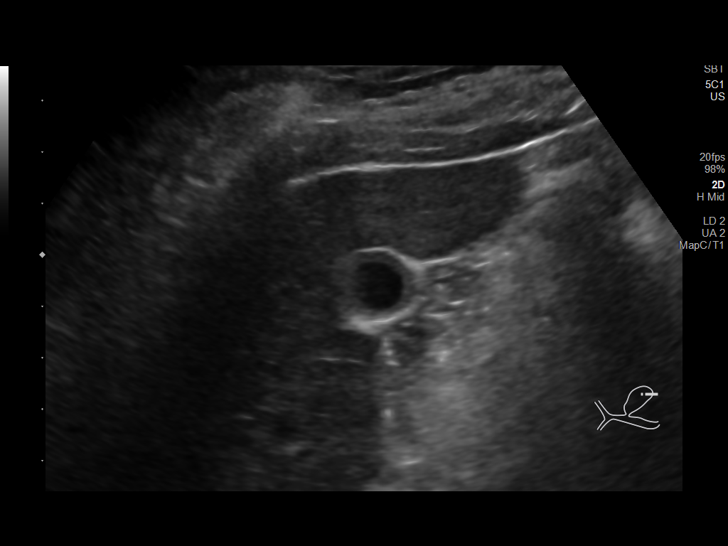
[im 11/44]
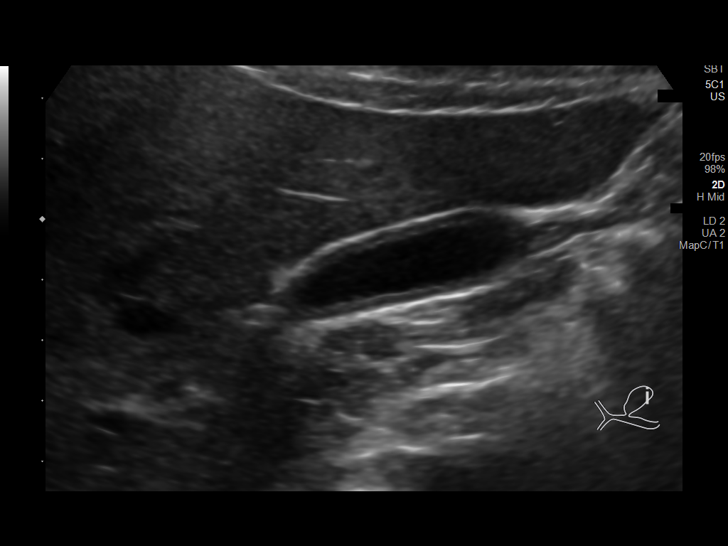
[im 15/44]
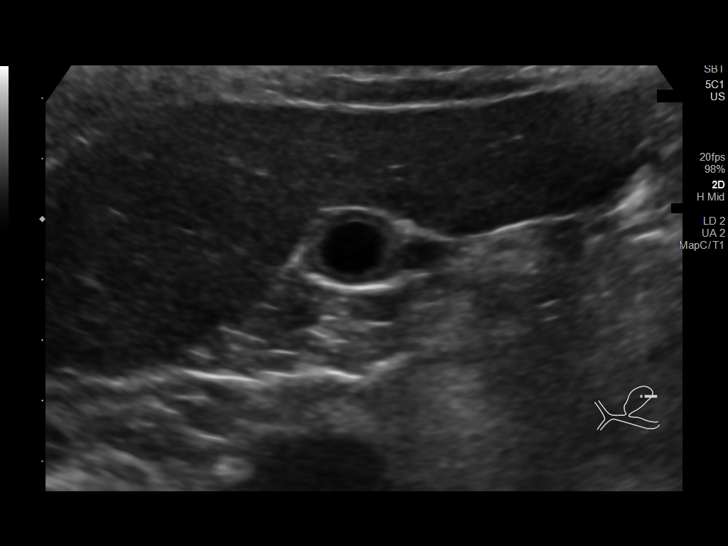
[im 17/44]
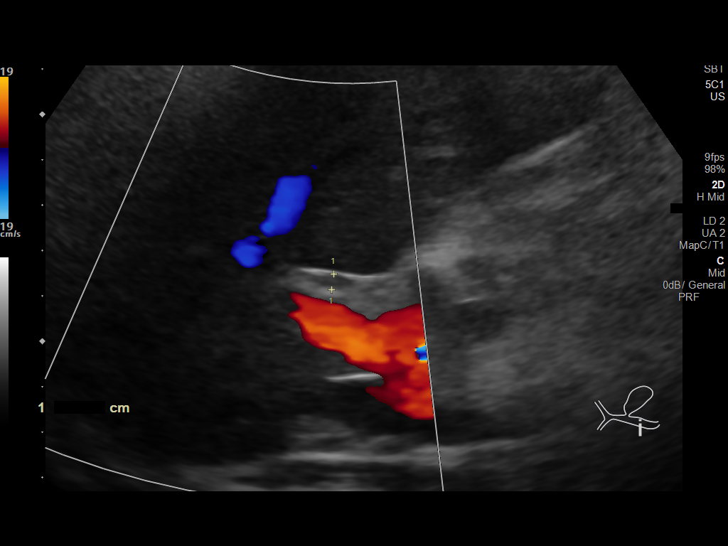
[im 20/44]
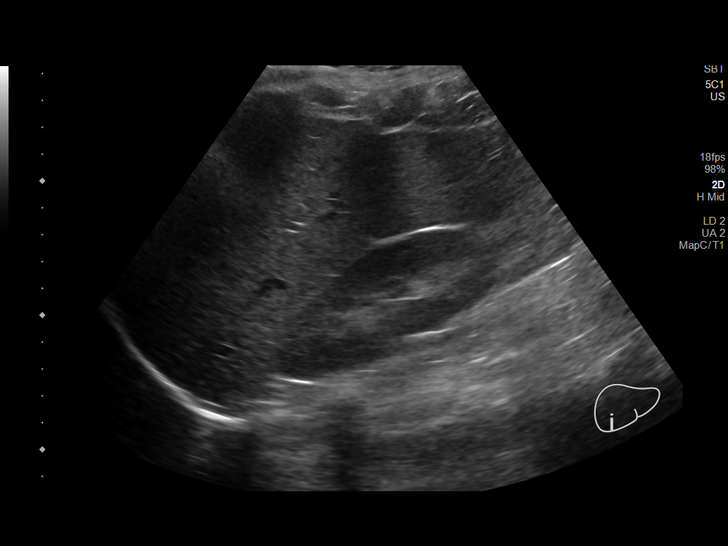
[im 24/44]
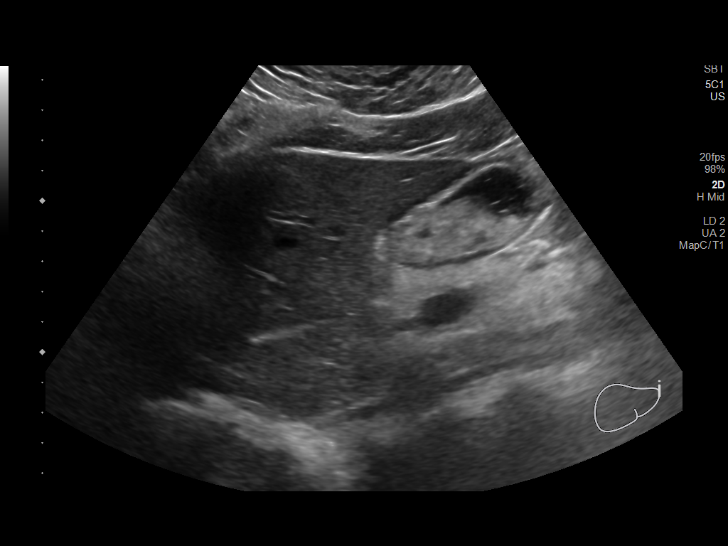
[im 27/44]
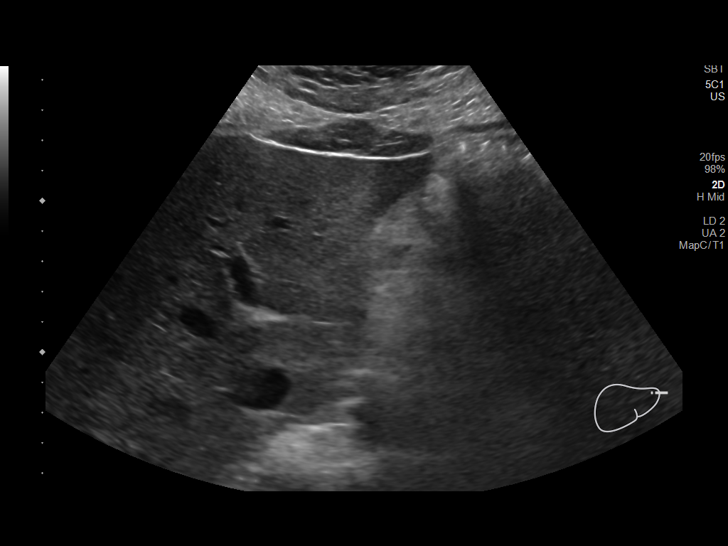
[im 29/44]
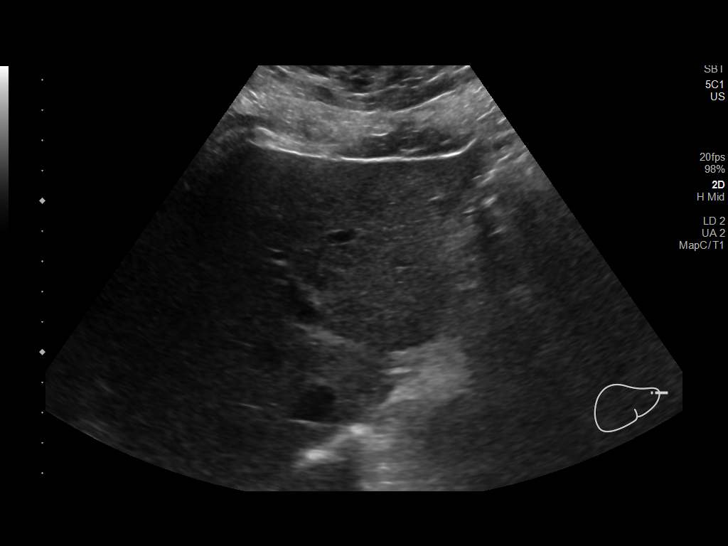
[im 33/44]
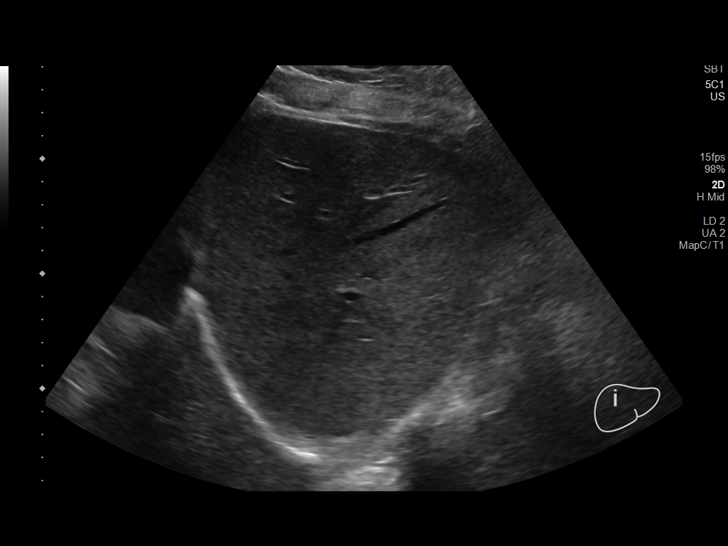
[im 36/44]
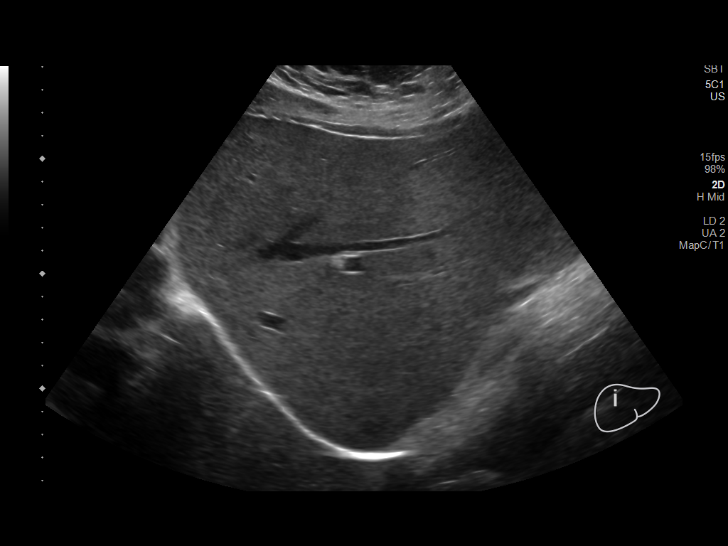
[im 40/44]
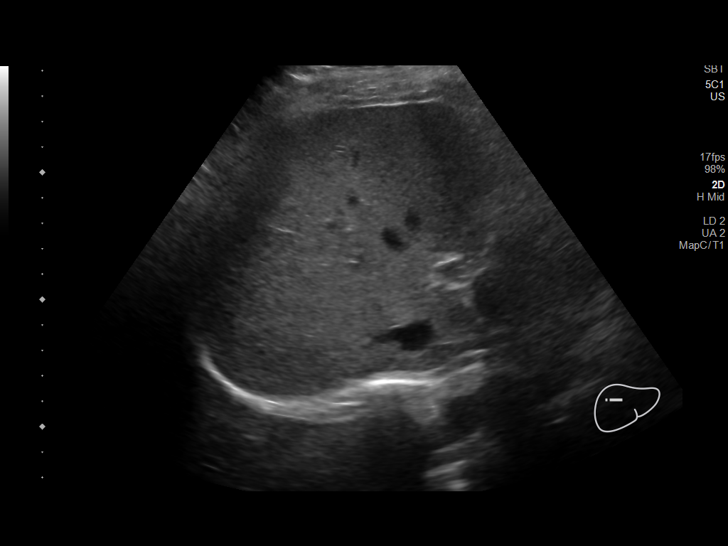
[im 44/44]
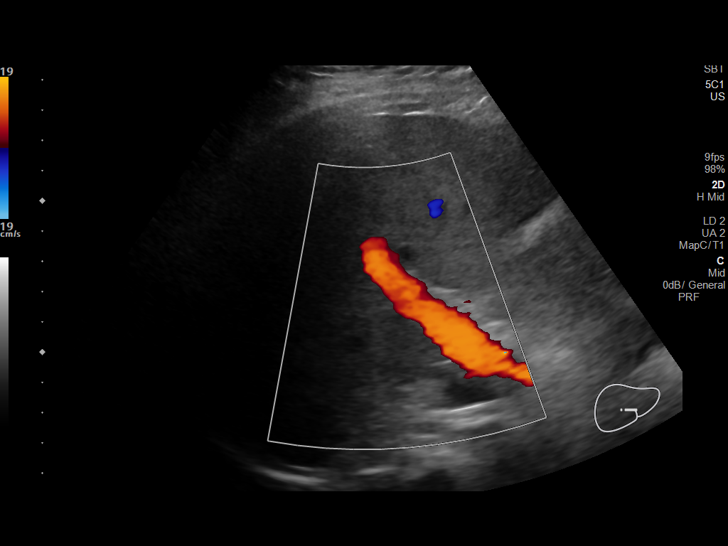

[14 of 25 positions shown; findings below may reference images not displayed]

FINDINGS: Gallbladder:

Gallbladder is contracted but otherwise unremarkable.

Common bile duct:

Diameter: 0.5 mm

Liver:

No focal lesion identified. Within normal limits in parenchymal
echogenicity. Portal vein is patent on color Doppler imaging with
normal direction of blood flow towards the liver.
IMPRESSION: Normal right upper quadrant ultrasound.

## 2023-02-12 ENCOUNTER — Emergency Department (HOSPITAL_BASED_OUTPATIENT_CLINIC_OR_DEPARTMENT_OTHER): Payer: No Typology Code available for payment source

## 2023-02-12 ENCOUNTER — Emergency Department (HOSPITAL_BASED_OUTPATIENT_CLINIC_OR_DEPARTMENT_OTHER)
Admission: EM | Admit: 2023-02-12 | Discharge: 2023-02-12 | Disposition: A | Discharge status: Home / Self Care | Payer: No Typology Code available for payment source | Attending: Emergency Medicine | Admitting: Emergency Medicine

## 2023-02-12 ENCOUNTER — Other Ambulatory Visit: Payer: Self-pay

## 2023-02-12 ENCOUNTER — Encounter (HOSPITAL_BASED_OUTPATIENT_CLINIC_OR_DEPARTMENT_OTHER): Payer: Self-pay

## 2023-02-12 DIAGNOSIS — M546 Pain in thoracic spine: Secondary | ICD-10-CM | POA: Diagnosis not present

## 2023-02-12 DIAGNOSIS — R112 Nausea with vomiting, unspecified: Secondary | ICD-10-CM | POA: Diagnosis present

## 2023-02-12 LAB — URINALYSIS, ROUTINE W REFLEX MICROSCOPIC
Bilirubin Urine: NEGATIVE
Glucose, UA: NEGATIVE mg/dL
Hgb urine dipstick: NEGATIVE
Ketones, ur: NEGATIVE mg/dL
Leukocytes,Ua: NEGATIVE
Nitrite: NEGATIVE
Protein, ur: NEGATIVE mg/dL
Specific Gravity, Urine: 1.025 (ref 1.005–1.030)
pH: 7 (ref 5.0–8.0)

## 2023-02-12 LAB — COMPREHENSIVE METABOLIC PANEL
ALT: 13 U/L (ref 0–44)
AST: 14 U/L — ABNORMAL LOW (ref 15–41)
Albumin: 3.9 g/dL (ref 3.5–5.0)
Alkaline Phosphatase: 43 U/L (ref 38–126)
Anion gap: 10 (ref 5–15)
BUN: 21 mg/dL — ABNORMAL HIGH (ref 6–20)
CO2: 19 mmol/L — ABNORMAL LOW (ref 22–32)
Calcium: 8.7 mg/dL — ABNORMAL LOW (ref 8.9–10.3)
Chloride: 106 mmol/L (ref 98–111)
Creatinine, Ser: 0.89 mg/dL (ref 0.44–1.00)
GFR, Estimated: >60 mL/min (ref >60)
Glucose, Bld: 113 mg/dL — ABNORMAL HIGH (ref 70–99)
Potassium: 3.7 mmol/L (ref 3.5–5.1)
Sodium: 135 mmol/L (ref 135–145)
Total Bilirubin: 0.4 mg/dL (ref 0.3–1.2)
Total Protein: 6.9 g/dL (ref 6.5–8.1)

## 2023-02-12 LAB — CBC
HCT: 41.4 % (ref 36.0–46.0)
Hemoglobin: 13.8 g/dL (ref 12.0–15.0)
MCH: 27.7 pg (ref 26.0–34.0)
MCHC: 33.3 g/dL (ref 30.0–36.0)
MCV: 83 fL (ref 80.0–100.0)
Platelets: 312 10*3/uL (ref 150–400)
RBC: 4.99 MIL/uL (ref 3.87–5.11)
RDW: 14.6 % (ref 11.5–15.5)
WBC: 7.2 10*3/uL (ref 4.0–10.5)
nRBC: 0 % (ref 0.0–0.2)

## 2023-02-12 LAB — LIPASE, BLOOD: Lipase: 61 U/L — ABNORMAL HIGH (ref 11–51)

## 2023-02-12 LAB — PREGNANCY, URINE: Preg Test, Ur: NEGATIVE

## 2023-02-12 MED ORDER — ONDANSETRON 4 MG PO TBDP: 4.0000 mg | ORAL_TABLET | Freq: Three times a day (TID) | ORAL | 0 refills | Status: DC | PRN

## 2023-02-12 MED ORDER — IBUPROFEN 800 MG PO TABS
800.0000 mg | ORAL_TABLET | Freq: Once | ORAL | Status: AC
  Administered 2023-02-12: 800 mg via ORAL
  Filled 2023-02-12: qty 1

## 2023-02-12 MED ORDER — IOHEXOL 300 MG/ML  SOLN
100.0000 mL | Freq: Once | INTRAMUSCULAR | Status: AC | PRN
  Administered 2023-02-12: 100 mL via INTRAVENOUS

## 2023-02-12 MED ORDER — OXYCODONE HCL 5 MG PO TABS
5.0000 mg | ORAL_TABLET | Freq: Once | ORAL | Status: AC
  Administered 2023-02-12: 5 mg via ORAL
  Filled 2023-02-12: qty 1

## 2023-02-12 MED ORDER — ACETAMINOPHEN 325 MG PO TABS
650.0000 mg | ORAL_TABLET | Freq: Once | ORAL | Status: AC
  Administered 2023-02-12: 650 mg via ORAL
  Filled 2023-02-12: qty 2

## 2023-02-12 MED ORDER — ONDANSETRON 4 MG PO TBDP
4.0000 mg | ORAL_TABLET | Freq: Once | ORAL | Status: AC
  Administered 2023-02-12: 4 mg via ORAL
  Filled 2023-02-12: qty 1

## 2023-02-12 NOTE — ED Triage Notes (Signed)
C/o vomiting since yesterday morning, also has some right sided back pain.

## 2023-02-12 NOTE — Discharge Instructions (Addendum)
Your labs, ultrasound and CT scan were reassuring today. Please take your medications as prescribed. Take tylenol/ibuprofen for pain. I recommend close follow-up with PCP for reevaluation.  Please do not hesitate to return to emergency department if worrisome signs symptoms we discussed become apparent.

## 2023-02-12 NOTE — ED Notes (Signed)
Pt denies nausea at this time.

## 2023-02-12 NOTE — ED Notes (Signed)
D/c paperwork reviewed with pt, including prescriptions and follow up care.  No questions or concerns voiced at time of d/c. . Pt verbalized understanding, Ambulatory with family to ED exit, NAD.   

## 2023-02-12 NOTE — ED Provider Notes (Signed)
[place] [facility] Provider Note   CSN: [id number] Arrival date & time: 02/12/23  1108     History  Chief Complaint  Patient presents with   Vomiting    Becky Green is a 37 y.o. female otherwise healthy presents today for evaluation of vomiting.  Patient reports that she started to have vomiting when she was at work yesterday.  She reports 2 episodes of vomiting yesterday and 2 episodes this morning.  States the emesis was nonbloody nonbilious.  She denies any abdominal pain.  However she states that when she was at work yesterday she had an acute onset of back pain below her right rib cage while lifting her R arm.  She denies any fever, chest pain, shortness of breath, headache, lightheadedness, dizziness, bowel changes, urinary symptoms.  States she smoked a pack of cigarettes a day and occasional drinking.  HPI   History reviewed. No pertinent past medical history. History reviewed. No pertinent surgical history.   Home Medications Prior to Admission medications   Medication Sig Start Date End Date Taking? Authorizing Provider  docusate sodium (COLACE) 100 MG capsule Take 1 capsule (100 mg total) by mouth 2 (two) times daily as needed for mild constipation. 06/13/21   Caccavale, Sophia, PA-C  polyethylene glycol powder (MIRALAX) 17 GM/SCOOP powder Use 1-2 capfuls in 8 oz water up to 3 times a day until having regular bowel movements. 06/13/21   Caccavale, Sophia, PA-C      Allergies    Patient has no known allergies.    Review of Systems   Review of Systems Negative except as per HPI.  Physical Exam Updated Vital Signs BP 125/78 (BP Location: Left Arm)   Pulse 82   Temp 98.2 F (36.8 C) (Oral)   Resp 18   Ht 4\' 11"  (1.499 m)   Wt 85.7 kg   LMP 01/21/2023 (Approximate)   SpO2 100%   BMI 38.17 kg/m  Physical Exam Vitals and nursing note reviewed.  Constitutional:      Appearance: Normal appearance.  HENT:     Head:  Normocephalic and atraumatic.     Mouth/Throat:     Mouth: Mucous membranes are moist.  Eyes:     General: No scleral icterus. Cardiovascular:     Rate and Rhythm: Normal rate and regular rhythm.     Pulses: Normal pulses.     Heart sounds: Normal heart sounds.  Pulmonary:     Effort: Pulmonary effort is normal.     Breath sounds: Normal breath sounds.  Abdominal:     General: Abdomen is flat.     Palpations: Abdomen is soft.     Tenderness: There is no abdominal tenderness.  Musculoskeletal:        General: No deformity.     Comments: Reproducible tenderness to palpation to R mid thoracic back under the rib cage.  Skin:    General: Skin is warm.     Findings: No rash.  Neurological:     General: No focal deficit present.     Mental Status: She is alert.  Psychiatric:        Mood and Affect: Mood normal.     ED Results / Procedures / Treatments   Labs (all labs ordered are listed, but only abnormal results are displayed) Labs Reviewed  LIPASE, BLOOD - Abnormal; Notable for the following components:      Result Value   Lipase 61 (*)    All other components  within normal limits  COMPREHENSIVE METABOLIC PANEL - Abnormal; Notable for the following components:   CO2 19 (*)    Glucose, Bld 113 (*)    BUN 21 (*)    Calcium 8.7 (*)    AST 14 (*)    All other components within normal limits  CBC  URINALYSIS, ROUTINE W REFLEX MICROSCOPIC  PREGNANCY, URINE    EKG None  Radiology No results found.  Procedures Procedures    Medications Ordered in ED Medications  acetaminophen (TYLENOL) tablet 650 mg (has no administration in time range)  ibuprofen (ADVIL) tablet 800 mg (has no administration in time range)  ondansetron (ZOFRAN-ODT) disintegrating tablet 4 mg (has no administration in time range)    ED Course/ Medical Decision Making/ A&P                             Medical Decision Making Amount and/or Complexity of Data Reviewed Labs: ordered. Radiology:  ordered.  Risk OTC drugs. Prescription drug management.   This patient presents to the ED for vomiting, right-sided back pain, this involves an extensive number of treatment options, and is a complaint that carries with a high risk of complications and morbidity.  The differential diagnosis includes small bowel obstruction, acute gastroenteritis, appendicitis, gallbladder/biliary, pancreatitis, peptic ulcer disease, perforation, increased ICP meningitis, vertigo ACS/MI DKA, EtOH intoxication, cannabinoid hyperemesis.  This is not an exhaustive list.  Lab tests: I ordered and personally interpreted labs.  The pertinent results include: WBC unremarkable. Hbg unremarkable. Platelets unremarkable. Electrolytes unremarkable. BUN 21, creatinine unremarkable.  Lipase 61.  Urinalysis unremarkable.  Pregnancy test negative.  Imaging studies: I ordered imaging studies. I personally reviewed, interpreted imaging and agree with the radiologist's interpretations. The results include: CT abdomen pelvis and ultrasound right upper quadrant showed no acute abnormalities.  Problem list/ ED course/ Critical interventions/ Medical management: HPI: See above Vital signs within normal range and stable throughout visit. Laboratory/imaging studies significant for: See above. On physical examination, patient is afebrile and appears in no acute distress. Abdominal exam without peritoneal signs. No evidence of acute abdomen at this time. Well appearing. Given work up, low suspicion for acute hepatobiliary disease (including acute cholecystitis or cholangitis), acute pancreatitis (neg [place]), PUD (including gastric perforation), acute infectious processes (hepatitis, pyelonephritis), acute appendicitis, vascular catastrophe, bowel obstruction, viscus perforation, or testicular torsion, diverticulitis. Presentation not consistent with other acute, emergent causes of abdominal pain at this time.  Given Tylenol, Roxicodone,  ibuprofen, Zofran here.  Reevaluation of patient after this medication showed that she improved. Advised patient to take Tylenol/ibuprofen/naproxen for pain, follow-up with primary care physician for further evaluation and management, return to the ER if new or worsening symptoms.  I have reviewed the patient home medicines and have made adjustments as needed.  Cardiac monitoring/EKG: The patient was maintained on a cardiac monitor.  I personally reviewed and interpreted the cardiac monitor which showed an underlying rhythm of: sinus rhythm.  Additional history obtained: External records from outside source obtained and reviewed including: Chart review including previous notes, labs, imaging.  Consultations obtained:  Disposition Continued outpatient therapy. Follow-up with PCP recommended for reevaluation of symptoms. Treatment plan discussed with patient.  Pt acknowledged understanding was agreeable to the plan. Worrisome signs and symptoms were discussed with patient, and patient acknowledged understanding to return to the ED if they noticed these signs and symptoms. Patient was stable upon discharge.   This chart was dictated using voice recognition  software.  Despite best efforts to proofread,  errors can occur which can change the documentation meaning.          Final Clinical Impression(s) / ED Diagnoses Final diagnoses:  Nausea and vomiting, unspecified vomiting type    Rx / DC Orders ED Discharge Orders          Ordered    ondansetron (ZOFRAN-ODT) 4 MG disintegrating tablet  Every 8 hours PRN        02/12/23 1717              Jeanelle Malling, PA 02/12/23 1722    Ernie Avena, MD 02/12/23 1944

## 2023-02-12 NOTE — ED Notes (Signed)
Pt ambulatory to bathroom, no assistance.

## 2023-02-12 NOTE — ED Notes (Signed)
US at bedside

## 2023-06-26 IMAGING — DX DG CHEST 1V PORT
1 series · 1 of 1 positions shown · non-contrast
Comparison: 08/11/2018

CLINICAL DATA: Rib pain cough

EXAM:
PORTABLE CHEST 1 VIEW

[chest ap]
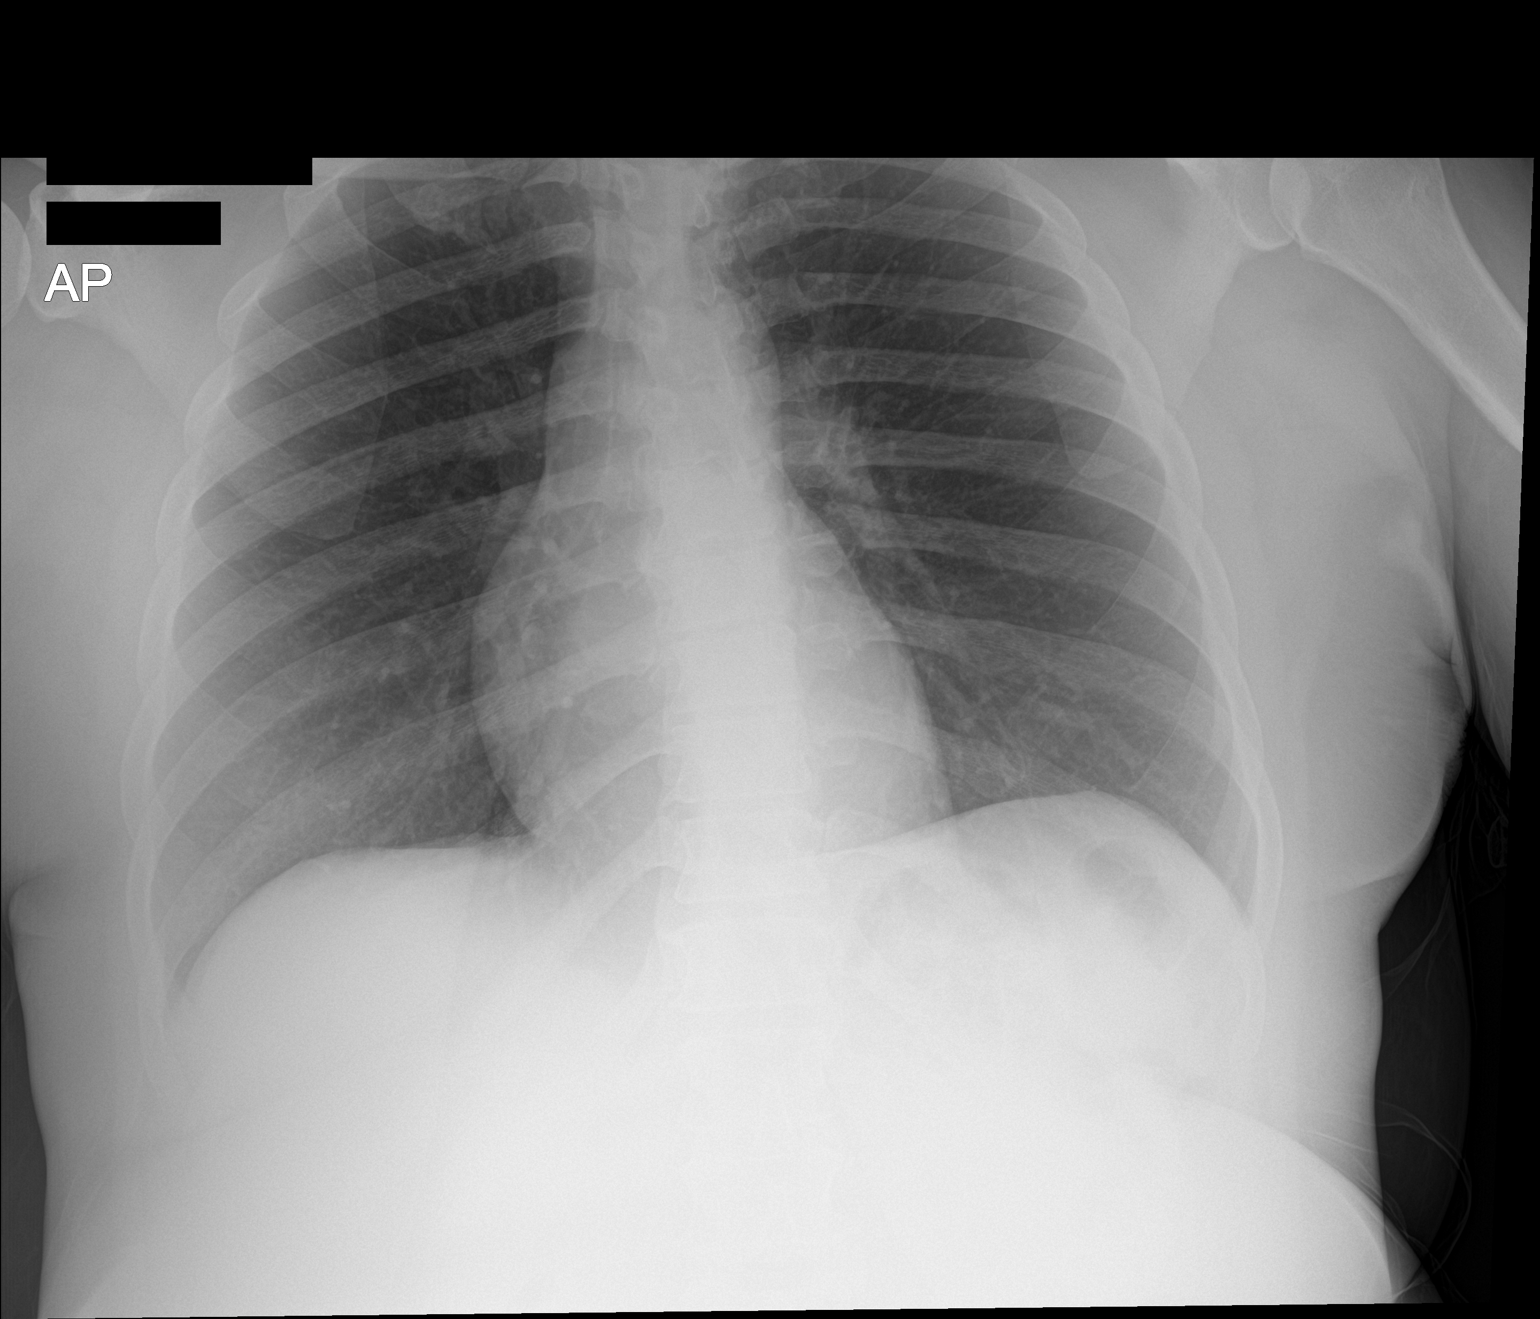

[1 of 1 positions shown; findings below may reference images not displayed]

FINDINGS: The heart size and mediastinal contours are within normal limits.
Both lungs are clear. The visualized skeletal structures are
unremarkable.
IMPRESSION: No active disease.

## 2023-07-25 IMAGING — CT CT ABD-PELV W/ CM
2 of 4 series · 16 of 46 positions shown, 18 images · IV contrast (omnipaque)
Comparison: CT September 04, 2019

CLINICAL DATA: Abdominal pain and constipation

EXAM:
CT ABDOMEN AND PELVIS WITH CONTRAST
TECHNIQUE: Multidetector CT imaging of the abdomen and pelvis was performed
using the standard protocol following bolus administration of
intravenous contrast.
CONTRAST:  85mL OMNIPAQUE IOHEXOL 350 MG/ML SOLN

[Series 2: axial st · axial · 0.98mm/px · z∈[-480,-75]mm · 13 of 89 slices shown, 15 images]
[im 4/89  soft-tissue]
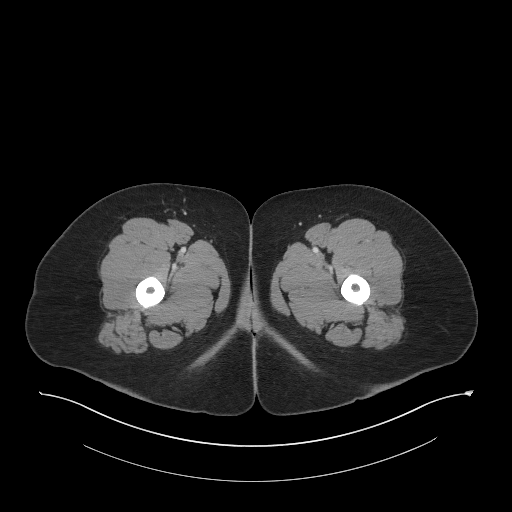
[im 4/89  bone]
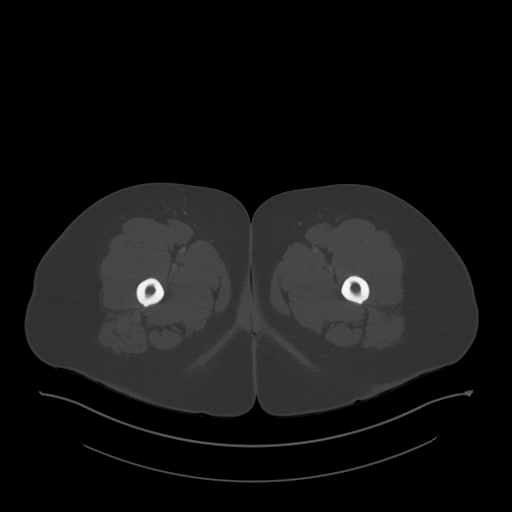
[im 11/89  soft-tissue]
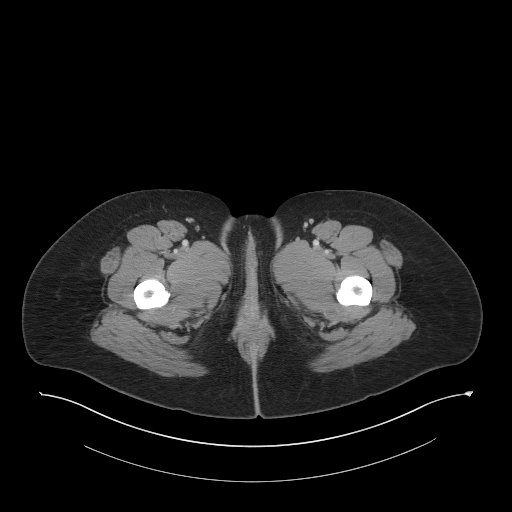
[im 18/89  soft-tissue]
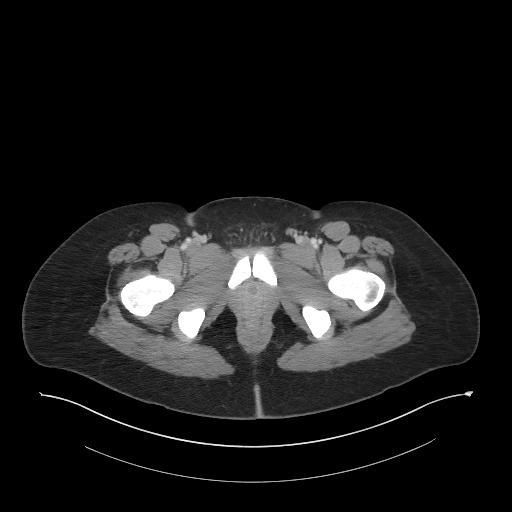
[im 25/89  soft-tissue]
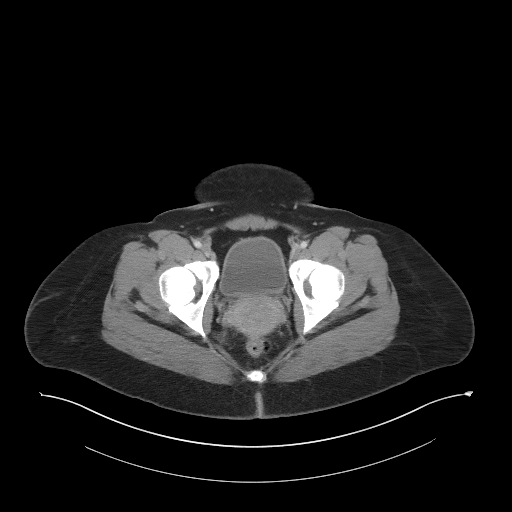
[im 32/89  soft-tissue]
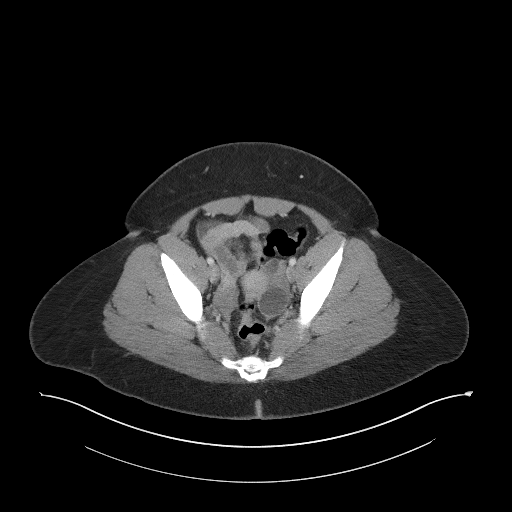
[im 39/89  soft-tissue]
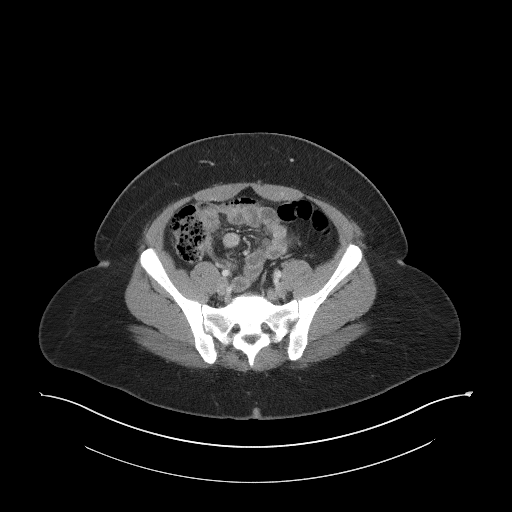
[im 46/89  soft-tissue]
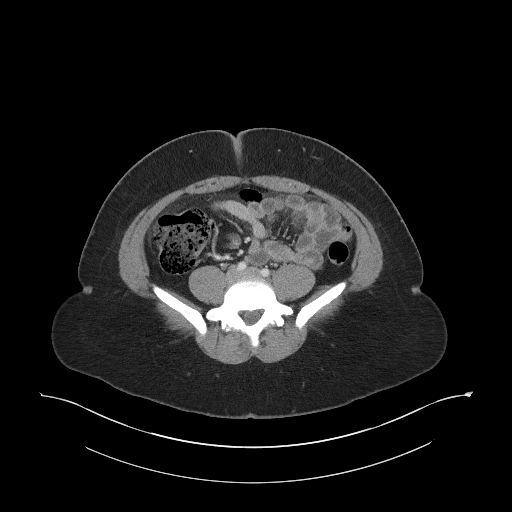
[im 50/89  soft-tissue]
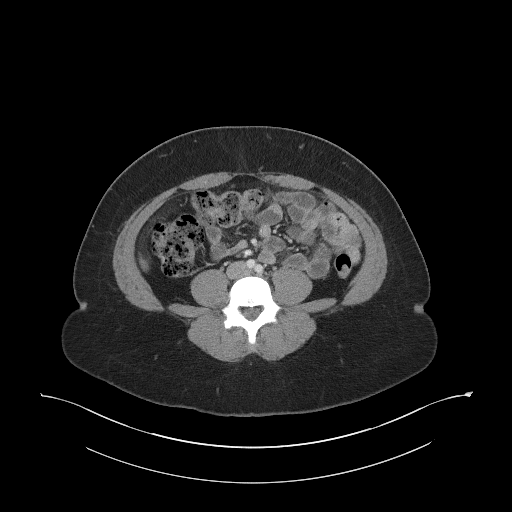
[im 57/89  soft-tissue]
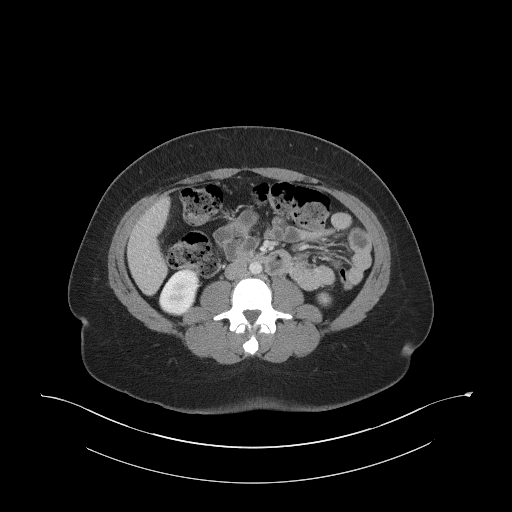
[im 57/89  bone]
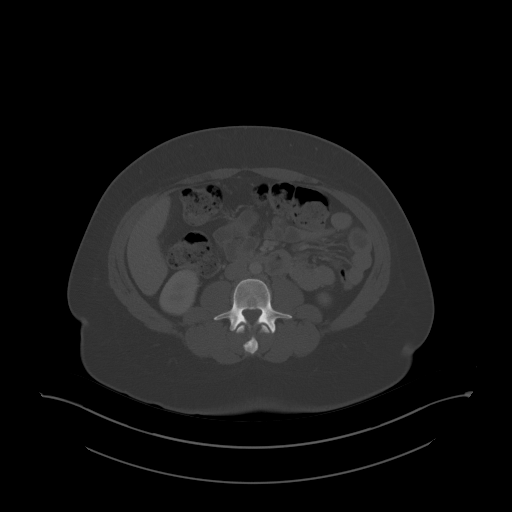
[im 64/89  soft-tissue]
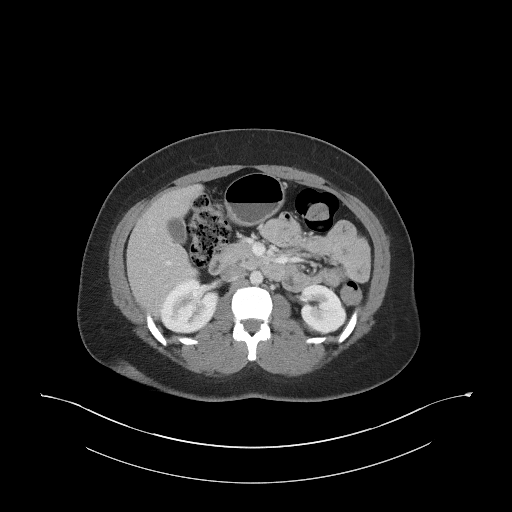
[im 71/89  soft-tissue]
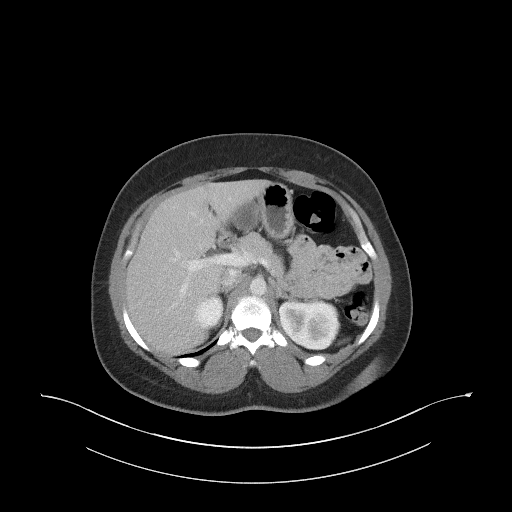
[im 78/89  soft-tissue]
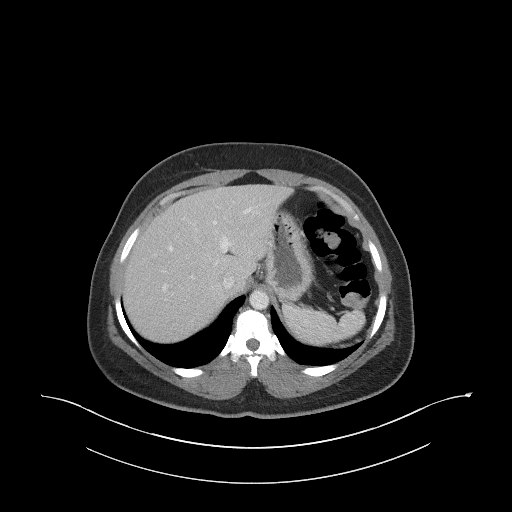
[im 85/89  soft-tissue]
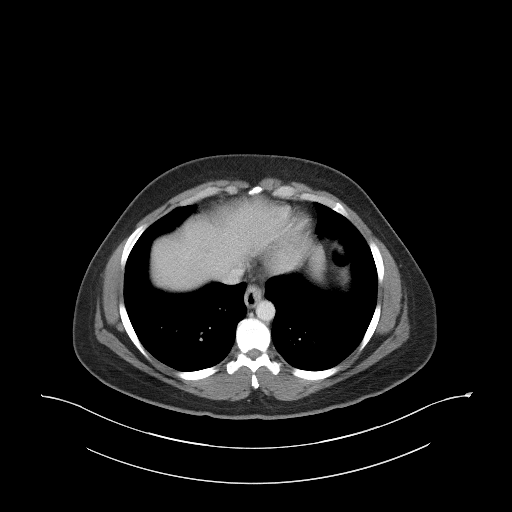

[Series 5: coronal st · coronal · 0.84mm/px · 3 of 96 slices shown]
[im 32/96  soft-tissue]
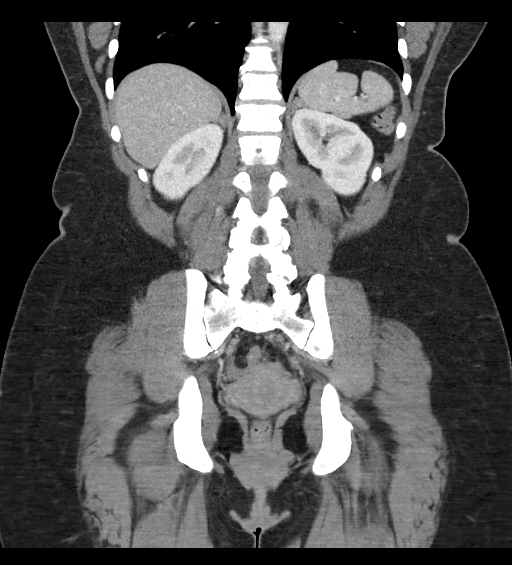
[im 43/96  soft-tissue]
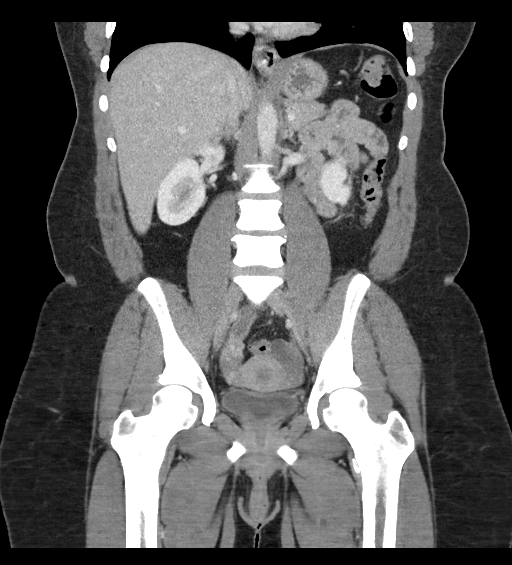
[im 53/96  soft-tissue]
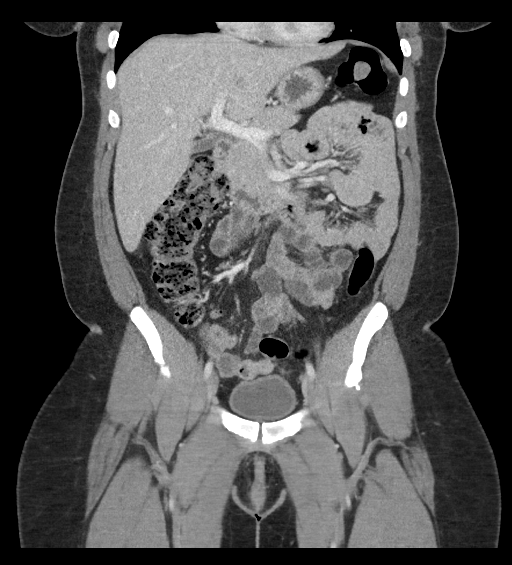

[16 of 46 positions shown; findings below may reference images not displayed]

FINDINGS: Lower chest: No acute abnormality.  Small hiatal hernia.

Hepatobiliary: No suspicious hepatic lesion. Gallbladder is
unremarkable. No biliary ductal dilation.

Pancreas: Unremarkable. No pancreatic ductal dilatation or
surrounding inflammatory changes.

Spleen: Normal in size without focal abnormality.

Adrenals/Urinary Tract: Adrenal glands are unremarkable. Kidneys are
normal, without renal calculi, solid enhancing lesion, or
hydronephrosis. Bladder is unremarkable for degree of distension.

Stomach/Bowel: Small hiatal hernia otherwise the stomach is within
normal limits. Appendix appears normal. No evidence of bowel wall
thickening, distention, or inflammatory changes.

Vascular/Lymphatic: Aortic atherosclerosis without aneurysmal
dilation. No pathologically enlarged abdominal or pelvic lymph
nodes.

Reproductive: Uterus is unremarkable. Corpus luteal cyst in the
right ovary. Similar appearance of the 2.9 cm left ovarian cysts and
adjacent dilated tubular structure dating back to CT September 04, 2019 and pelvic ultrasound September 03, 2018.

Other: Trace pelvic free fluid, likely physiologic. No walled off
fluid collections. No pneumoperitoneum.

Musculoskeletal: No acute or significant osseous findings.
IMPRESSION: 1. No acute abdominopelvic findings.
2. Moderate volume of formed stool in the ascending and transverse
colon, possibly reflecting constipation.
3. Simple appearing of the 2.9 cm left ovarian cyst and adjacent
dilated tubular structure appears similar dating back to at least CT
September 04, 2019 and pelvic ultrasound September 03, 2018,. Findings
likely reflecting an ovarian cyst and hydrosalpinx.
4. Small hiatal hernia.
5.  Aortic Atherosclerosis ([id number]).

## 2024-03-25 ENCOUNTER — Other Ambulatory Visit: Payer: Self-pay

## 2024-03-25 ENCOUNTER — Emergency Department (HOSPITAL_BASED_OUTPATIENT_CLINIC_OR_DEPARTMENT_OTHER)
Admission: EM | Admit: 2024-03-25 | Discharge: 2024-03-25 | Disposition: A | Discharge status: Home / Self Care | Attending: Emergency Medicine | Admitting: Emergency Medicine

## 2024-03-25 ENCOUNTER — Encounter (HOSPITAL_BASED_OUTPATIENT_CLINIC_OR_DEPARTMENT_OTHER): Payer: Self-pay

## 2024-03-25 ENCOUNTER — Emergency Department (HOSPITAL_BASED_OUTPATIENT_CLINIC_OR_DEPARTMENT_OTHER)

## 2024-03-25 DIAGNOSIS — R112 Nausea with vomiting, unspecified: Secondary | ICD-10-CM | POA: Insufficient documentation

## 2024-03-25 DIAGNOSIS — R1084 Generalized abdominal pain: Secondary | ICD-10-CM | POA: Diagnosis not present

## 2024-03-25 DIAGNOSIS — R1030 Lower abdominal pain, unspecified: Secondary | ICD-10-CM | POA: Diagnosis present

## 2024-03-25 LAB — URINALYSIS, ROUTINE W REFLEX MICROSCOPIC
Bilirubin Urine: NEGATIVE
Glucose, UA: NEGATIVE mg/dL
Hgb urine dipstick: NEGATIVE
Ketones, ur: NEGATIVE mg/dL
Leukocytes,Ua: NEGATIVE
Nitrite: NEGATIVE
Protein, ur: NEGATIVE mg/dL
Specific Gravity, Urine: 1.025 (ref 1.005–1.030)
pH: 6.5 (ref 5.0–8.0)

## 2024-03-25 LAB — CBC
HCT: 43 % (ref 36.0–46.0)
Hemoglobin: 13.9 g/dL (ref 12.0–15.0)
MCH: 26.1 pg (ref 26.0–34.0)
MCHC: 32.3 g/dL (ref 30.0–36.0)
MCV: 80.7 fL (ref 80.0–100.0)
Platelets: 317 10*3/uL (ref 150–400)
RBC: 5.33 MIL/uL — ABNORMAL HIGH (ref 3.87–5.11)
RDW: 15.6 % — ABNORMAL HIGH (ref 11.5–15.5)
WBC: 7.8 10*3/uL (ref 4.0–10.5)
nRBC: 0 % (ref 0.0–0.2)

## 2024-03-25 LAB — COMPREHENSIVE METABOLIC PANEL WITH GFR
ALT: 12 U/L (ref 0–44)
AST: 15 U/L (ref 15–41)
Albumin: 4.5 g/dL (ref 3.5–5.0)
Alkaline Phosphatase: 71 U/L (ref 38–126)
Anion gap: 13 (ref 5–15)
BUN: 14 mg/dL (ref 6–20)
CO2: 20 mmol/L — ABNORMAL LOW (ref 22–32)
Calcium: 9.2 mg/dL (ref 8.9–10.3)
Chloride: 105 mmol/L (ref 98–111)
Creatinine, Ser: 0.95 mg/dL (ref 0.44–1.00)
GFR, Estimated: >60 mL/min (ref >60)
Glucose, Bld: 96 mg/dL (ref 70–99)
Potassium: 4.4 mmol/L (ref 3.5–5.1)
Sodium: 138 mmol/L (ref 135–145)
Total Bilirubin: 0.2 mg/dL (ref 0.0–1.2)
Total Protein: 7.5 g/dL (ref 6.5–8.1)

## 2024-03-25 LAB — PREGNANCY, URINE: Preg Test, Ur: NEGATIVE

## 2024-03-25 LAB — LIPASE, BLOOD: Lipase: 32 U/L (ref 11–51)

## 2024-03-25 MED ORDER — MORPHINE SULFATE (PF) 4 MG/ML IV SOLN
4.0000 mg | Freq: Once | INTRAVENOUS | Status: AC
  Administered 2024-03-25: 4 mg via INTRAVENOUS
  Filled 2024-03-25 (×2): qty 1

## 2024-03-25 MED ORDER — ONDANSETRON HCL 4 MG/2ML IJ SOLN
4.0000 mg | Freq: Once | INTRAMUSCULAR | Status: AC
Start: 2024-03-25 — End: 2024-03-25
  Administered 2024-03-25: 4 mg via INTRAVENOUS
  Filled 2024-03-25: qty 2

## 2024-03-25 MED ORDER — IOHEXOL 300 MG/ML  SOLN
100.0000 mL | Freq: Once | INTRAMUSCULAR | Status: AC | PRN
Start: 2024-03-25 — End: 2024-03-25
  Administered 2024-03-25: 100 mL via INTRAVENOUS

## 2024-03-25 MED ORDER — ONDANSETRON 4 MG PO TBDP: 4.0000 mg | ORAL_TABLET | Freq: Three times a day (TID) | ORAL | 0 refills | Status: AC | PRN

## 2024-03-25 NOTE — ED Provider Notes (Signed)
 [place] [facility] Provider Note   CSN: [id number] Arrival date & time: 03/25/24  1532     Patient presents with: Abdominal Pain   Becky Green is a 38 y.o. female patient with past medical history of left ureteral injury, recent nephrostomy tube removal presenting to emergency room with complaint of abdominal pain.  Patient reports she has some left flank pain and suprapubic pain that started yesterday.  This is associated with some nausea, 1 episode of vomiting that was nonbilious nonbloody this morning, decreased bowel movement.  Her last normal bowel movement was 2 days ago, reports passing gas. She tried lactulose. Denies dysuria, fever.     Abdominal Pain      Prior to Admission medications   Medication Sig Start Date End Date Taking? Authorizing Provider  docusate sodium  (COLACE) 100 MG capsule Take 1 capsule (100 mg total) by mouth 2 (two) times daily as needed for mild constipation. 06/13/21   Caccavale, Sophia, PA-C  ondansetron  (ZOFRAN -ODT) 4 MG disintegrating tablet Take 1 tablet (4 mg total) by mouth every 8 (eight) hours as needed. 02/12/23   Thomes Flicker, PA  polyethylene glycol powder (MIRALAX ) 17 GM/SCOOP powder Use 1-2 capfuls in 8 oz water up to 3 times a day until having regular bowel movements. 06/13/21   Caccavale, Sophia, PA-C    Allergies: Latex    Review of Systems  Gastrointestinal:  Positive for abdominal pain.    Updated Vital Signs BP 121/63 (BP Location: Right Arm)   Pulse 80   Temp 97.8 F (36.6 C)   Resp 18   Wt 90.7 kg   SpO2 100%   BMI 40.40 kg/m   Physical Exam Vitals and nursing note reviewed.  Constitutional:      General: She is not in acute distress.    Appearance: She is not toxic-appearing.  HENT:     Head: Normocephalic and atraumatic.   Eyes:     General: No scleral icterus.    Conjunctiva/sclera: Conjunctivae normal.    Cardiovascular:     Rate and Rhythm: Normal rate and regular  rhythm.     Pulses: Normal pulses.     Heart sounds: Normal heart sounds.  Pulmonary:     Effort: Pulmonary effort is normal. No respiratory distress.     Breath sounds: Normal breath sounds.  Abdominal:     General: Abdomen is flat. Bowel sounds are normal.     Palpations: Abdomen is soft.     Tenderness: There is abdominal tenderness. There is no right CVA tenderness or left CVA tenderness.     Comments: Tenderness to palpation over suprapubic area, left lower quadrant, left flank.   Skin:    General: Skin is warm and dry.     Findings: No lesion.   Neurological:     General: No focal deficit present.     Mental Status: She is alert and oriented to person, place, and time. Mental status is at baseline.     (all labs ordered are listed, but only abnormal results are displayed) Labs Reviewed  LIPASE, BLOOD  COMPREHENSIVE METABOLIC PANEL WITH GFR  CBC  URINALYSIS, ROUTINE W REFLEX MICROSCOPIC  PREGNANCY, URINE    EKG: None  Radiology: No results found.   Procedures   Medications Ordered in the ED - No data to display  Medical Decision Making Amount and/or Complexity of Data Reviewed Labs: ordered. Radiology: ordered.  Risk Prescription drug management.   This patient presents to the ED for concern of abdominal pain, this involves an extensive number of treatment options, and is a complaint that carries with it a high risk of complications and morbidity.  The differential diagnosis includes kidney stone, hydronephrosis, pyelonephritis, urinary tract infection, diverticulitis, gastroenteritis, enteritis, small bowel obstruction   Co morbidities that complicate the patient evaluation  See HPI   Additional history obtained:  Additional history obtained from 03/18/24 OV with urology for nephrostomy removal    Lab Tests:  I personally interpreted labs.  The pertinent results include:   CBC without leukocytosis.  No anemia.   CMP without electrolyte abnormality.  Normal liver and kidney function.  Lipase 32.  Pregnancy test negative.  UA negative for nitrates, negative for leukocytes.   Imaging Studies ordered:  I ordered imaging studies including CT abd/pelvis   I independently visualized and interpreted imaging which showed chronic scarring versus pyelonephritis of the left kidney.  No other acute findings. (UA is negative, do not feel this is clinically consistent with pyelonephritis) I agree with the radiologist interpretation   Cardiac Monitoring: / EKG:  The patient was maintained on a cardiac monitor.    Problem List / ED Course / Critical interventions / Medication management  Patient presented to emergency room with complaint of abdominal pain.  She recently had nephrostomy tube removed on the left side.  Her incisions are clean dry and intact.  She does have reported discomfort over her incision site.  She has no focal mild tenderness on exam. No peritoneal signs. She has no CVA tenderness.  Labs are overall very reassuring without leukocytosis.  Her UA is negative for nitrates and leukocytes.  She has no sign of systemic illness.  CT scan with mild left renal parenchymal atrophy and cortical scarring which is likely chronic scarring versus pyelonephritis.  Her UA does not show infection thus I do not feel this is consistent with pyelonephritis.  Patient is feeling better after medications in the ED.  She is requesting discharge and she is requesting something to eat. Tolerating oral intake, feeling better.  Has appropriate outpatient follow-up.  Feel appropriate for discharge. I ordered medication including morphine , zofran , NS Reevaluation of the patient after these medicines showed that the patient improved I have reviewed the patients home medicines and have made adjustments as needed   Plan F/u w/ PCP in 2-3d to ensure resolution of sx.  Patient was given return precautions. Patient stable for  discharge at this time.  Patient educated on sx/dx and verbalized understanding of plan. Return to ER w/ new or worsening sx.       Final diagnoses:  Generalized abdominal pain    ED Discharge Orders          Ordered    ondansetron  (ZOFRAN -ODT) 4 MG disintegrating tablet  Every 8 hours PRN        03/25/24 1930               Stefanie Hodgens, Kandace Organ, PA-C 03/25/24 2057    Nicklas Barns, MD 03/26/24 2356

## 2024-03-25 NOTE — ED Triage Notes (Signed)
 Pt arrives with c/o ABD pain that started yesterday. Pt endorses n/v and chills. Pt had a nephrostomy tube removed about a week ago.

## 2024-03-25 NOTE — ED Notes (Signed)
 Patient transported to CT
